# Patient Record
Sex: Female | Born: 1975 | Race: White | Hispanic: No | Marital: Married | Smoking: Former smoker
Health system: Southern US, Community
[De-identification: ages and names within clinical notes are randomized; demographics above are authoritative.]

## PROBLEM LIST (undated history)

## (undated) DIAGNOSIS — K589 Irritable bowel syndrome without diarrhea: Secondary | ICD-10-CM

## (undated) HISTORY — DX: Irritable bowel syndrome, unspecified: K58.9

---

## 2003-06-27 ENCOUNTER — Encounter: Admission: RE | Admit: 2003-06-27 | Discharge: 2003-06-27 | Payer: Self-pay | Admitting: Obstetrics and Gynecology

## 2003-06-27 ENCOUNTER — Other Ambulatory Visit: Admission: RE | Admit: 2003-06-27 | Discharge: 2003-06-27 | Payer: Self-pay | Admitting: Family Medicine

## 2003-07-11 ENCOUNTER — Encounter: Admission: RE | Admit: 2003-07-11 | Discharge: 2003-07-11 | Payer: Self-pay | Admitting: Obstetrics and Gynecology

## 2003-09-12 ENCOUNTER — Encounter: Admission: RE | Admit: 2003-09-12 | Discharge: 2003-09-12 | Payer: Self-pay | Admitting: Obstetrics and Gynecology

## 2003-09-26 ENCOUNTER — Encounter: Admission: RE | Admit: 2003-09-26 | Discharge: 2003-09-26 | Payer: Self-pay | Admitting: Obstetrics and Gynecology

## 2011-05-26 ENCOUNTER — Other Ambulatory Visit: Payer: Self-pay | Admitting: Dermatology

## 2012-06-22 ENCOUNTER — Encounter: Payer: Self-pay | Admitting: Cardiovascular Disease

## 2012-06-22 ENCOUNTER — Ambulatory Visit (INDEPENDENT_AMBULATORY_CARE_PROVIDER_SITE_OTHER): Payer: Commercial Managed Care - PPO | Admitting: Cardiovascular Disease

## 2012-06-22 VITALS — BP 122/70 | HR 82 | Ht 66.0 in | Wt 162.0 lb

## 2012-06-22 DIAGNOSIS — R0789 Other chest pain: Secondary | ICD-10-CM

## 2012-06-22 DIAGNOSIS — R011 Cardiac murmur, unspecified: Secondary | ICD-10-CM

## 2012-06-22 DIAGNOSIS — Z83438 Family history of other disorder of lipoprotein metabolism and other lipidemia: Secondary | ICD-10-CM

## 2012-06-22 DIAGNOSIS — Z8349 Family history of other endocrine, nutritional and metabolic diseases: Secondary | ICD-10-CM

## 2012-06-22 DIAGNOSIS — I4510 Unspecified right bundle-branch block: Secondary | ICD-10-CM

## 2012-06-22 DIAGNOSIS — R5381 Other malaise: Secondary | ICD-10-CM

## 2012-06-22 NOTE — Patient Instructions (Addendum)
Your physician has requested that you have an echocardiogram. Echocardiography is a painless test that uses sound waves to create images of your heart. It provides your doctor with information about the size and shape of your heart and how well your heart's chambers and valves are working. This procedure takes approximately one hour. There are no restrictions for this procedure.  Your physician has requested that you have an exercise myoview. For further information please visit [email or web address] Please follow instruction sheet, as given.   Your physician recommends that you return for lab work .  Your physician recommends that you schedule a follow-up appointment in:  4 to 6 weeks.

## 2012-06-28 ENCOUNTER — Encounter: Payer: Self-pay | Admitting: Cardiovascular Disease

## 2012-06-28 DIAGNOSIS — I4510 Unspecified right bundle-branch block: Secondary | ICD-10-CM | POA: Insufficient documentation

## 2012-06-28 DIAGNOSIS — R011 Cardiac murmur, unspecified: Secondary | ICD-10-CM | POA: Insufficient documentation

## 2012-06-28 DIAGNOSIS — R0789 Other chest pain: Secondary | ICD-10-CM | POA: Insufficient documentation

## 2012-06-28 DIAGNOSIS — Z83438 Family history of other disorder of lipoprotein metabolism and other lipidemia: Secondary | ICD-10-CM | POA: Insufficient documentation

## 2012-06-28 NOTE — Progress Notes (Signed)
Patient ID: Terri Wilkinson, female   DOB: 1975-12-27, 37 y.o.   MRN: [id number] PATIENT PROFILE: Terri Wilkinson is 37 year old female who recently experienced an episode of chest heaviness and was noted to have possible right bundle branch block. She now presents for cardiology evaluation.   HPI: Terri Wilkinson denies any known cardiac history. She was recently evaluated at [facility] by Dr. Rolan Lipa after experiencing an episode of chest heaviness and pressure which lasted most of the morning. Apparently she then underwent a CT scan and I do not have the results of this but apparently this was interpreted as normal. He was concerned that she may have right bundle branch block. She presents now for cardiology to evaluation. She denies any significant exertional precipitation to her chest discomfort. She does have a family history for heart disease.  No past medical history on file.  No past surgical history on file.  No Known Allergies  Current Outpatient Prescriptions  Medication Sig Dispense Refill  . Prenatal Vit-Fe Fum-FA-Omega (ONE-A-DAY WOMENS PRENATAL PO) Take by mouth.       No current facility-administered medications for this visit.    Socially, she is married for 8 years. She works as a Transport planner for [employer]. She achieved a bachelor degree from [employer]. She does housecleaning yard work he remains active. She does drink occasional wine. There is a remote tobacco history but she quit in 2010. He does walk and does Pilates several days per week.  No family history on file.  ROS is negative for fever chills night sweats. She denies rash. She denies palpitations. She denies presyncope or syncope. She denies wheezing. She is unaware of any known heart murmur. He does have some difficulty with her stomach at times. She does have a sister who has also colitis. She states her stomach problems have improved with probiotic treatment. He is concerned possibly that she may have some lactose  insufficiency or interval bowel syndrome but has never been fully diagnosed. She denies bleeding. She denies claudication. She denies neurologic symptoms. Other system review is negative.  PE BP 122/70  Pulse 82  Ht 5\' 6"  (1.676 m)  Wt 162 lb (73.483 kg)  BMI 26.16 kg/m2 General: Alert, oriented, no distress.  HEENT: Normocephalic, atraumatic. Pupils round and reactive; sclera anicteric; Fundi normal Nose without nasal septal hypertrophy Mouth/Parynx benign; Mallinpatti scale 2 Neck: No JVD, no carotid briuts Lungs: clear to ausculatation and percussion; no wheezing or rales Heart: RRR, s1 s2 normal  1-2 SEM, no s3. Abdomen: soft, nontender; no hepatosplenomehaly, BS+; abdominal aorta nontender and not dilated by palpation. Pulses 2+ Extremities: no clubbinbg cyanosis or edema, Homan's sign negative  Neurologic: grossly nonfocal    ECG: I did review the EKG which was done at [facility] on 05/10/2012 per this revealed sinus rhythm at 78 beats per minute with RV conduction delay/incomplete right bundle branch block. QTc interval 417 ms.  EKG today in our office reveals normal sinus rhythm at 82 beats per minute. There is a mild right ventricular conduction delay. She does not meet criteria for right bundle branch block.  ASSESSMENT AND PLAN: From a cardiac standpoint, Terri Wilkinson seems to be doing fairly well. She apparently had a CT scan of her chest and I will try to obtain the results. This apparently was done in [facility] and by the patient's report  this is negative. 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 She does have a cardiac murmur presently.  I'm checking a complete set of laboratory consisting of a CBC, comprehensive metabolic panel, lipid panel, TSH, as well as vitamin D level. I'm scheduling her for a 2-D echo Doppler study to further to further evaluate systolic and diastolic function as well as  her cardiac murmur. In light of her episode of chest heaviness which he described as a pressure sensation I am scheduling her for an exercise Myoview scan. I will see her back in the office in 4-6 weeks and followup of the above studies.    Lennette Bihari, MD, [facility] 06/28/2012 1:44 PM

## 2012-07-03 ENCOUNTER — Encounter: Payer: Self-pay | Admitting: Cardiovascular Disease

## 2012-07-18 ENCOUNTER — Ambulatory Visit (HOSPITAL_COMMUNITY)
Admission: RE | Admit: 2012-07-18 | Discharge: 2012-07-18 | Disposition: A | Discharge status: Home / Self Care | Payer: Commercial Managed Care - PPO | Source: Ambulatory Visit | Attending: Cardiovascular Disease | Admitting: Cardiovascular Disease

## 2012-07-18 DIAGNOSIS — R0789 Other chest pain: Secondary | ICD-10-CM

## 2012-07-18 DIAGNOSIS — R079 Chest pain, unspecified: Secondary | ICD-10-CM | POA: Insufficient documentation

## 2012-07-18 DIAGNOSIS — R9431 Abnormal electrocardiogram [ECG] [EKG]: Secondary | ICD-10-CM | POA: Insufficient documentation

## 2012-07-18 DIAGNOSIS — R011 Cardiac murmur, unspecified: Secondary | ICD-10-CM | POA: Insufficient documentation

## 2012-07-18 MED ORDER — TECHNETIUM TC 99M SESTAMIBI GENERIC - CARDIOLITE
10.6000 | Freq: Once | INTRAVENOUS | Status: AC | PRN
  Administered 2012-07-18: 11 via INTRAVENOUS

## 2012-07-18 MED ORDER — TECHNETIUM TC 99M SESTAMIBI GENERIC - CARDIOLITE
29.7000 | Freq: Once | INTRAVENOUS | Status: AC | PRN
  Administered 2012-07-18: 30 via INTRAVENOUS

## 2012-07-18 NOTE — Progress Notes (Signed)
[facility]   2D echo completed 07/18/2012.   Cindy Tahara Ruffini, RDCS  

## 2012-07-18 NOTE — Procedures (Addendum)
 [place] [facility] [address] [place] [address] [phone]  Cardiology Nuclear Med Study  Terri Wilkinson is a 37 y.o. female     MRN : [id number]     DOB: 1975/03/18  Procedure Date: 07/18/2012  Nuclear Med Background Indication for Stress Test:  Evaluation for Ischemia and Abnormal EKG History:  MURMUR Cardiac Risk Factors: Family History - CAD and History of Smoking  Symptoms:  Chest Pain   Nuclear Pre-Procedure Caffeine/Decaff Intake:  10:00pm NPO After: 8:00am   IV Site: R Antecubital  IV 0.9% NS with Angio Cath:  22g  Chest Size (in):  N/A IV Started by: Terri Pomfret, RN  Height: 5\' 6"  (1.676 m)  Cup Size: D  BMI:  Body mass index is 24.87 kg/(m^2). Weight:  154 lb (69.854 kg)   Tech Comments:  N/A    Nuclear Med Study 1 or 2 day study: 1 day  Stress Test Type:  Stress  Order Authorizing Provider:  Nicki Guadalajara, MD   Resting Radionuclide: Technetium 62m Sestamibi  Resting Radionuclide Dose: 10.6 mCi   Stress Radionuclide:  Technetium 47m Sestamibi  Stress Radionuclide Dose: 29.7 mCi           Stress Protocol Rest HR: 82 Stress HR: 164  Rest BP: 132/82 Stress BP: 151/111  Exercise Time (min): 9 METS: 10.1   Predicted Max HR: 182 bpm % Max HR: 90.11 bpm Rate Pressure Product: 21308  Dose of Adenosine (mg):  n/a Dose of Lexiscan: n/a mg  Dose of Atropine (mg): n/a Dose of Dobutamine: n/a mcg/kg/min (at max HR)  Stress Test Technologist: Terri Wilkinson, CCT Nuclear Technologist: Terri Wilkinson, CNMT   Rest Procedure:  Myocardial perfusion imaging was performed at rest 45 minutes following the intravenous administration of Technetium 59m Sestamibi. Stress Procedure:  The patient performed treadmill exercise using a Bruce  Protocol for 9 minutes. The patient stopped due to mild SOB and attaining target heart rate and denied any chest pain.  There were no significant ST-T wave changes.  Technetium 74m Sestamibi  was injected at peak exercise and myocardial perfusion imaging was performed after a brief delay.  Transient Ischemic Dilatation (Normal <1.22):  0.88 Lung/Heart Ratio (Normal <0.45):  0.28 QGS EDV:  81 ml QGS ESV:  27 ml LV Ejection Fraction: 67%  Rest ECG: NSR - Normal EKG  Stress ECG: Insignificant upsloping ST segment depression.  QPS Raw Data Images:  Normal; no motion artifact; normal heart/lung ratio. Stress Images:  Normal homogeneous uptake in all areas of the myocardium. Rest Images:  Normal homogeneous uptake in all areas of the myocardium. Subtraction (SDS):  No evidence of ischemia.  Impression Exercise Capacity:  Good exercise capacity. BP Response:  Normal blood pressure response. Clinical Symptoms:  No significant symptoms noted. ECG Impression:  Insignificant upsloping ST segment depression. Comparison with Prior Nuclear Study: No previous nuclear study performed  Overall Impression:  Normal stress nuclear study.  Good exercise effort. No ischemic EKG changes.  LV Wall Motion:  NL LV Function; NL Wall Motion; EF 67%.  Terri Nose, MD, [facility] Board Certified in Nuclear Cardiology Attending Cardiologist [facility]  Terri Nose, MD  07/18/2012 6:13 PM

## 2012-07-20 LAB — CBC
Platelets: 183 10*3/uL (ref 150–400)
RBC: 4.43 MIL/uL (ref 3.87–5.11)
RDW: 14.1 % (ref 11.5–15.5)
WBC: 3.9 10*3/uL — ABNORMAL LOW (ref 4.0–10.5)

## 2012-07-20 LAB — TSH: TSH: 1.93 u[IU]/mL (ref 0.350–4.500)

## 2012-07-20 LAB — COMPREHENSIVE METABOLIC PANEL
ALT: 11 U/L (ref 0–35)
AST: 11 U/L (ref 0–37)
Albumin: 4.1 g/dL (ref 3.5–5.2)
CO2: 24 mEq/L (ref 19–32)
Calcium: 9.1 mg/dL (ref 8.4–10.5)
Chloride: 105 mEq/L (ref 96–112)
Potassium: 4.2 mEq/L (ref 3.5–5.3)
Total Protein: 6.5 g/dL (ref 6.0–8.3)

## 2012-07-20 LAB — LIPID PANEL: Cholesterol: 168 mg/dL (ref 0–200)

## 2012-07-24 LAB — VITAMIN D 1,25 DIHYDROXY
Vitamin D 1, 25 (OH)2 Total: 86 pg/mL — ABNORMAL HIGH (ref 18–72)
Vitamin D2 1, 25 (OH)2: <8 pg/mL
Vitamin D3 1, 25 (OH)2: 86 pg/mL

## 2012-07-25 ENCOUNTER — Ambulatory Visit (INDEPENDENT_AMBULATORY_CARE_PROVIDER_SITE_OTHER): Payer: Commercial Managed Care - PPO | Admitting: Cardiovascular Disease

## 2012-07-25 ENCOUNTER — Encounter: Payer: Self-pay | Admitting: Cardiovascular Disease

## 2012-07-25 VITALS — BP 120/70 | HR 74 | Ht 66.0 in | Wt 159.9 lb

## 2012-07-25 DIAGNOSIS — R011 Cardiac murmur, unspecified: Secondary | ICD-10-CM

## 2012-07-25 DIAGNOSIS — Z83438 Family history of other disorder of lipoprotein metabolism and other lipidemia: Secondary | ICD-10-CM

## 2012-07-25 DIAGNOSIS — R0789 Other chest pain: Secondary | ICD-10-CM

## 2012-07-25 DIAGNOSIS — Z8349 Family history of other endocrine, nutritional and metabolic diseases: Secondary | ICD-10-CM

## 2012-07-25 NOTE — Progress Notes (Signed)
Patient ID: Terri Wilkinson, female   DOB: 1975/11/28, 37 y.o.   MRN: [id number]  HPI: Terri Wilkinson presents to the office today for followup evaluation after experiencing an episode of chest heaviness and was noted to have possible right bundle branch block. I had seen Terri Wilkinson for initial cardiology consultation on 06/28/2012.   Terri Wilkinson was recently evaluated at [facility] by Dr. Rolan Lipa after experiencing an episode of chest heaviness and pressure which lasted most of the morning. Apparently Terri Wilkinson then underwent a CT scan  but apparently this was interpreted as normal. He was concerned that Terri Wilkinson may have right bundle branch block. Terri Wilkinson presented  for cardiology to evaluation. Terri Wilkinson denies any significant exertional precipitation to Terri Wilkinson chest discomfort. Terri Wilkinson does have a family history for heart disease.  This durum underwent a nuclear perfusion study on 07/18/2012 which revealed entirely normal perfusion and normal function. Ejection fraction post-stress was 67%. An echo Doppler study showed normal systolic and diastolic function with an ejection fraction of 55-60%. K. trileaflet aortic valve. Terri Wilkinson was not found to have significant valvular abnormalities.  Laboratory done at [facility] revealed a normal CBC as well as comprehensive metabolic panel. Terri Wilkinson total cholesterol was 168 triglycerides 168 HDL 54 LDL cholesterol 80. Vitamin D level 89,TSH 1.9 thyroid function normal. Terri Wilkinson presents for evaluation  No past medical history on file.   No Known Allergies  Current Outpatient Prescriptions  Medication Sig Dispense Refill  . Prenatal Vit-Fe Fum-FA-Omega (ONE-A-DAY WOMENS PRENATAL PO) Take by mouth.      . Probiotic Product (PROBIOTIC DAILY PO) Take by mouth daily.       No current facility-administered medications for this visit.    Socially, Terri Wilkinson is married for 8 years. 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 Terri Wilkinson achieved a bachelor degree from [employer]. Terri Wilkinson does housecleaning yard work he remains active.  Terri Wilkinson does drink occasional wine. There is a remote tobacco history but Terri Wilkinson quit in 2010. He does walk and does Pilates several days per week.   ROS is negative for fever chills night sweats. Terri Wilkinson denies rash. Terri Wilkinson denies palpitations. Terri Wilkinson denies presyncope or syncope. Terri Wilkinson denies wheezing. Terri Wilkinson is unaware of any known heart murmur. He does have some difficulty with Terri Wilkinson stomach at times. Terri Wilkinson does have a sister who has also colitis. Terri Wilkinson states Terri Wilkinson stomach problems have improved with probiotic treatment. He is concerned possibly that Terri Wilkinson may have some lactose insufficiency or interval bowel syndrome but has never been fully diagnosed. Terri Wilkinson denies bleeding. Terri Wilkinson denies claudication. Terri Wilkinson denies neurologic symptoms. Other system review is negative.  PE BP 120/70  Pulse 74  Ht 5\' 6"  (1.676 m)  Wt 159 lb 14.4 oz (72.53 kg)  BMI 25.82 kg/m2 General: Alert, oriented, no distress.  HEENT: Normocephalic, atraumatic. Pupils round and reactive; sclera anicteric;  Nose without nasal septal hypertrophy Mouth/Parynx benign; Mallinpatti scale 2 Neck: No JVD, no carotid briuts Lungs: clear to ausculatation and percussion; no wheezing or rales Heart: RRR, s1 s2 normal  1 SEM, no s3. Abdomen: soft, nontender; no hepatosplenomehaly, BS+; abdominal aorta nontender and not dilated by palpation. Pulses 2+ Extremities: no clubbinbg cyanosis or edema, Homan's sign negative  Neurologic: grossly nonfocal   ECG: I did review the EKG which was done at [facility] on 05/10/2012 per this revealed sinus rhythm at 78 beats per minute with RV conduction delay/incomplete right bundle branch block. QTc interval 417 ms.  EKG on 06/27/12 revealed normal sinus rhythm at  82 beats per minute. There is a mild right ventricular conduction delay. Terri Wilkinson does not meet criteria for right bundle branch block.  ASSESSMENT AND PLAN: From a cardiac standpoint, Terri Wilkinson seems to be doing fairly well. Terri Wilkinson recently had experience a vague chest  heaviness during some periods of increased stress. A recent nuclear perfusion study shows entirely normal myocardial perfusion without scar or ischemia and normal global LV contractility. Terri Wilkinson echo Doppler study is essentially normal in artery is again significant valvular pathology. I did review Terri Wilkinson laboratory with Terri Wilkinson in detail. We did discuss slight reduction in carbohydrates and sugars in light of Terri Wilkinson elevated triglycerides. The cardiac standpoint, I feel Terri Wilkinson is stable and no further workup is necessary at this point. I'll see Terri Wilkinson on a PRN basis if problems arise.   Lennette Bihari, MD, [facility] 07/25/2012 9:03 PM

## 2012-07-25 NOTE — Patient Instructions (Signed)
Your physician recommends that you schedule a follow-up appointment in: AS NEEDED  

## 2012-07-26 ENCOUNTER — Encounter: Payer: Self-pay | Admitting: Cardiovascular Disease

## 2012-07-26 NOTE — Progress Notes (Signed)
Quick Note:  Results discussed at 07/25/12/appointment. ______

## 2015-02-24 ENCOUNTER — Other Ambulatory Visit: Payer: Self-pay | Admitting: Obstetrics & Gynecology

## 2015-02-24 DIAGNOSIS — R928 Other abnormal and inconclusive findings on diagnostic imaging of breast: Secondary | ICD-10-CM

## 2015-02-26 ENCOUNTER — Ambulatory Visit
Admission: RE | Admit: 2015-02-26 | Discharge: 2015-02-26 | Disposition: A | Discharge status: Home / Self Care | Payer: BLUE CROSS/BLUE SHIELD | Source: Ambulatory Visit | Attending: Obstetrics & Gynecology | Admitting: Obstetrics & Gynecology

## 2015-02-26 DIAGNOSIS — R928 Other abnormal and inconclusive findings on diagnostic imaging of breast: Secondary | ICD-10-CM

## 2015-08-11 ENCOUNTER — Other Ambulatory Visit: Payer: Self-pay | Admitting: Obstetrics & Gynecology

## 2015-08-11 DIAGNOSIS — N6310 Unspecified lump in the right breast, unspecified quadrant: Secondary | ICD-10-CM

## 2015-08-27 ENCOUNTER — Other Ambulatory Visit: Payer: BLUE CROSS/BLUE SHIELD

## 2015-09-02 ENCOUNTER — Ambulatory Visit
Admission: RE | Admit: 2015-09-02 | Discharge: 2015-09-02 | Disposition: A | Discharge status: Home / Self Care | Payer: BLUE CROSS/BLUE SHIELD | Source: Ambulatory Visit | Attending: Obstetrics & Gynecology | Admitting: Obstetrics & Gynecology

## 2015-09-02 DIAGNOSIS — N63 Unspecified lump in breast: Secondary | ICD-10-CM | POA: Diagnosis not present

## 2015-09-02 DIAGNOSIS — N6310 Unspecified lump in the right breast, unspecified quadrant: Secondary | ICD-10-CM

## 2016-02-16 DIAGNOSIS — H6121 Impacted cerumen, right ear: Secondary | ICD-10-CM | POA: Diagnosis not present

## 2016-02-16 DIAGNOSIS — Z23 Encounter for immunization: Secondary | ICD-10-CM | POA: Diagnosis not present

## 2016-03-24 DIAGNOSIS — Z01419 Encounter for gynecological examination (general) (routine) without abnormal findings: Secondary | ICD-10-CM | POA: Diagnosis not present

## 2016-03-24 DIAGNOSIS — Z6824 Body mass index (BMI) 24.0-24.9, adult: Secondary | ICD-10-CM | POA: Diagnosis not present

## 2016-03-24 DIAGNOSIS — Z1151 Encounter for screening for human papillomavirus (HPV): Secondary | ICD-10-CM | POA: Diagnosis not present

## 2016-08-05 ENCOUNTER — Other Ambulatory Visit: Payer: Self-pay

## 2016-08-05 ENCOUNTER — Other Ambulatory Visit: Payer: Self-pay | Admitting: Obstetrics & Gynecology

## 2016-08-05 DIAGNOSIS — N63 Unspecified lump in unspecified breast: Secondary | ICD-10-CM

## 2016-08-10 ENCOUNTER — Ambulatory Visit: Admission: RE | Admit: 2016-08-10 | Payer: BLUE CROSS/BLUE SHIELD | Source: Ambulatory Visit

## 2016-08-10 ENCOUNTER — Ambulatory Visit
Admission: RE | Admit: 2016-08-10 | Discharge: 2016-08-10 | Disposition: A | Discharge status: Home / Self Care | Payer: BLUE CROSS/BLUE SHIELD | Source: Ambulatory Visit | Attending: Obstetrics & Gynecology | Admitting: Obstetrics & Gynecology

## 2016-08-10 DIAGNOSIS — N63 Unspecified lump in unspecified breast: Secondary | ICD-10-CM

## 2016-08-10 DIAGNOSIS — R928 Other abnormal and inconclusive findings on diagnostic imaging of breast: Secondary | ICD-10-CM | POA: Diagnosis not present

## 2016-10-25 DIAGNOSIS — R197 Diarrhea, unspecified: Secondary | ICD-10-CM | POA: Diagnosis not present

## 2016-12-07 DIAGNOSIS — R197 Diarrhea, unspecified: Secondary | ICD-10-CM | POA: Diagnosis not present

## 2016-12-07 DIAGNOSIS — K640 First degree hemorrhoids: Secondary | ICD-10-CM | POA: Diagnosis not present

## 2016-12-08 DIAGNOSIS — G43009 Migraine without aura, not intractable, without status migrainosus: Secondary | ICD-10-CM | POA: Diagnosis not present

## 2016-12-09 DIAGNOSIS — R197 Diarrhea, unspecified: Secondary | ICD-10-CM | POA: Diagnosis not present

## 2017-03-31 DIAGNOSIS — S139XXA Sprain of joints and ligaments of unspecified parts of neck, initial encounter: Secondary | ICD-10-CM | POA: Diagnosis not present

## 2017-04-25 ENCOUNTER — Other Ambulatory Visit: Payer: Self-pay

## 2017-04-27 ENCOUNTER — Other Ambulatory Visit: Payer: Self-pay | Admitting: *Deleted

## 2017-04-27 MED ORDER — NORETHINDRONE ACET-ETHINYL EST 1-20 MG-MCG PO TABS: 1.0000 | ORAL_TABLET | Freq: Every day | ORAL | 0 refills | Status: DC

## 2017-04-27 NOTE — Telephone Encounter (Signed)
Pt has annual scheduled on 06/30/17, needs refill on birth control pills, Rx sent.

## 2017-06-15 ENCOUNTER — Ambulatory Visit: Payer: BLUE CROSS/BLUE SHIELD | Admitting: Psychology

## 2017-06-15 DIAGNOSIS — F411 Generalized anxiety disorder: Secondary | ICD-10-CM | POA: Diagnosis not present

## 2017-06-21 DIAGNOSIS — M7051 Other bursitis of knee, right knee: Secondary | ICD-10-CM | POA: Diagnosis not present

## 2017-06-22 ENCOUNTER — Ambulatory Visit: Payer: BLUE CROSS/BLUE SHIELD | Admitting: Psychology

## 2017-06-22 DIAGNOSIS — F411 Generalized anxiety disorder: Secondary | ICD-10-CM | POA: Diagnosis not present

## 2017-06-30 ENCOUNTER — Encounter: Payer: Self-pay | Admitting: Obstetrics & Gynecology

## 2017-06-30 ENCOUNTER — Ambulatory Visit (INDEPENDENT_AMBULATORY_CARE_PROVIDER_SITE_OTHER): Payer: BLUE CROSS/BLUE SHIELD | Admitting: Obstetrics & Gynecology

## 2017-06-30 ENCOUNTER — Ambulatory Visit: Payer: BLUE CROSS/BLUE SHIELD | Admitting: Psychology

## 2017-06-30 VITALS — BP 134/82 | Ht 66.0 in | Wt 149.0 lb

## 2017-06-30 DIAGNOSIS — Z01419 Encounter for gynecological examination (general) (routine) without abnormal findings: Secondary | ICD-10-CM | POA: Diagnosis not present

## 2017-06-30 DIAGNOSIS — Z3041 Encounter for surveillance of contraceptive pills: Secondary | ICD-10-CM

## 2017-06-30 MED ORDER — NORETHINDRONE ACET-ETHINYL EST 1-20 MG-MCG PO TABS: 1.0000 | ORAL_TABLET | Freq: Every day | ORAL | 5 refills | Status: DC

## 2017-06-30 NOTE — Progress Notes (Signed)
    [facility] 05/26/1975 [id number]   History:    42 y.o. G0 Married  RP:  Established patient presenting for annual gyn exam   HPI: Well on continuous Junel 1/20.  No BTB.  LEEP in 2004.  Paps normal since then.  Breasts wnl.  BMI 24.05.  In good fitness.  Fasting Health Labs normal 2019 through husband's insurance.  Past medical history,surgical history, family history and social history were all reviewed and documented in the EPIC chart.  Gynecologic History No LMP recorded. (Menstrual status: Oral contraceptives). Contraception: OCP (estrogen/progesterone) Last Pap: 03/2016. Results were: Negative, HPV HR neg Last mammogram: 08/2016. Results were: Benign Bone Density: Never Colonoscopy: Never  Obstetric History OB History  Gravida Para Term Preterm AB Living  0 0 0 0 0 0  SAB TAB Ectopic Multiple Live Births  0 0 0 0 0     ROS: A ROS was performed and pertinent positives and negatives are included in the history.  GENERAL: No fevers or chills. HEENT: No change in vision, no earache, sore throat or sinus congestion. NECK: No pain or stiffness. CARDIOVASCULAR: No chest pain or pressure. No palpitations. PULMONARY: No shortness of breath, cough or wheeze. GASTROINTESTINAL: No abdominal pain, nausea, vomiting or diarrhea, melena or bright red blood per rectum. GENITOURINARY: No urinary frequency, urgency, hesitancy or dysuria. MUSCULOSKELETAL: No joint or muscle pain, no back pain, no recent trauma. DERMATOLOGIC: No rash, no itching, no lesions. ENDOCRINE: No polyuria, polydipsia, no heat or cold intolerance. No recent change in weight. HEMATOLOGICAL: No anemia or easy bruising or bleeding. NEUROLOGIC: No headache, seizures, numbness, tingling or weakness. PSYCHIATRIC: No depression, no loss of interest in normal activity or change in sleep pattern.     Exam:   BP (!) 144/90   Ht  (1.676 m)   Wt 149 lb (67.6 kg)   BMI 24.05 kg/m   Body mass index is 24.05  kg/m.  General appearance : Well developed well nourished female. No acute distress HEENT: Eyes: no retinal hemorrhage or exudates,  Neck supple, trachea midline, no carotid bruits, no thyroidmegaly Lungs: Clear to auscultation, no rhonchi or wheezes, or rib retractions  Heart: Regular rate and rhythm, no murmurs or gallops Breast:Examined in sitting and supine position were symmetrical in appearance, no palpable masses or tenderness,  no skin retraction, no nipple inversion, no nipple discharge, no skin discoloration, no axillary or supraclavicular lymphadenopathy Abdomen: no palpable masses or tenderness, no rebound or guarding Extremities: no edema or skin discoloration or tenderness  Pelvic: Vulva: Normal             Vagina: No gross lesions or discharge  Cervix: No gross lesions or discharge  Uterus  AV, normal size, shape and consistency, non-tender and mobile  Adnexa  Without masses or tenderness  Anus: Normal   Assessment/Plan:  42 y.o. female for annual exam   1. Well female exam with routine gynecological exam Normal gynecologic exam.  Pap negative with negative high risk HPV in February 2018.  Will repeat Pap test next year.  Breast exam normal.  Repeat screening mammogram in July 2019.  Health labs with her husband's office insurance.  2. Encounter for surveillance of contraceptive pills Well on Junel 1/20.  No contraindication.  Birth control pill prescription sent to pharmacy.  Other orders - norethindrone-ethinyl estradiol (MICROGESTIN,JUNEL,LOESTRIN) 1-20 MG-MCG tablet; Take 1 tablet by mouth daily. Continuous use  Genia Del MD, 8:22 AM 06/30/2017

## 2017-06-30 NOTE — Patient Instructions (Signed)
1. Well female exam with routine gynecological exam Normal gynecologic exam.  Pap negative with negative high risk HPV in February 2018.  Will repeat Pap test next year.  Breast exam normal.  Repeat screening mammogram in July 2019.  Health labs with her husband's office insurance.  2. Encounter for surveillance of contraceptive pills Well on Junel 1/20.  No contraindication.  Birth control pill prescription sent to pharmacy.  Other orders - norethindrone-ethinyl estradiol (MICROGESTIN,JUNEL,LOESTRIN) 1-20 MG-MCG tablet; Take 1 tablet by mouth daily. Continuous use  Marianela, it was a pleasure seeing you today!

## 2017-07-07 ENCOUNTER — Ambulatory Visit: Payer: BLUE CROSS/BLUE SHIELD | Admitting: Psychology

## 2017-07-07 DIAGNOSIS — F411 Generalized anxiety disorder: Secondary | ICD-10-CM

## 2017-07-20 DIAGNOSIS — M7051 Other bursitis of knee, right knee: Secondary | ICD-10-CM | POA: Diagnosis not present

## 2017-07-20 DIAGNOSIS — M25561 Pain in right knee: Secondary | ICD-10-CM | POA: Diagnosis not present

## 2017-07-26 ENCOUNTER — Other Ambulatory Visit: Payer: Self-pay | Admitting: Obstetrics & Gynecology

## 2017-07-26 DIAGNOSIS — Z1231 Encounter for screening mammogram for malignant neoplasm of breast: Secondary | ICD-10-CM

## 2017-08-12 ENCOUNTER — Ambulatory Visit: Payer: BLUE CROSS/BLUE SHIELD | Admitting: Psychology

## 2017-08-24 ENCOUNTER — Ambulatory Visit
Admission: RE | Admit: 2017-08-24 | Discharge: 2017-08-24 | Disposition: A | Discharge status: Home / Self Care | Payer: BLUE CROSS/BLUE SHIELD | Source: Ambulatory Visit | Attending: Obstetrics & Gynecology | Admitting: Obstetrics & Gynecology

## 2017-08-24 ENCOUNTER — Ambulatory Visit: Payer: BLUE CROSS/BLUE SHIELD | Admitting: Psychology

## 2017-08-24 ENCOUNTER — Other Ambulatory Visit: Payer: Self-pay | Admitting: Obstetrics & Gynecology

## 2017-08-24 DIAGNOSIS — Z1231 Encounter for screening mammogram for malignant neoplasm of breast: Secondary | ICD-10-CM

## 2017-08-24 DIAGNOSIS — N6310 Unspecified lump in the right breast, unspecified quadrant: Secondary | ICD-10-CM

## 2017-08-24 DIAGNOSIS — R928 Other abnormal and inconclusive findings on diagnostic imaging of breast: Secondary | ICD-10-CM | POA: Diagnosis not present

## 2017-11-01 DIAGNOSIS — M79641 Pain in right hand: Secondary | ICD-10-CM | POA: Diagnosis not present

## 2017-11-01 DIAGNOSIS — Z23 Encounter for immunization: Secondary | ICD-10-CM | POA: Diagnosis not present

## 2017-11-29 DIAGNOSIS — M150 Primary generalized (osteo)arthritis: Secondary | ICD-10-CM | POA: Diagnosis not present

## 2017-11-29 DIAGNOSIS — K580 Irritable bowel syndrome with diarrhea: Secondary | ICD-10-CM | POA: Diagnosis not present

## 2017-11-29 DIAGNOSIS — M2550 Pain in unspecified joint: Secondary | ICD-10-CM | POA: Diagnosis not present

## 2018-01-02 DIAGNOSIS — M2550 Pain in unspecified joint: Secondary | ICD-10-CM | POA: Diagnosis not present

## 2018-01-02 DIAGNOSIS — M150 Primary generalized (osteo)arthritis: Secondary | ICD-10-CM | POA: Diagnosis not present

## 2018-01-02 DIAGNOSIS — K580 Irritable bowel syndrome with diarrhea: Secondary | ICD-10-CM | POA: Diagnosis not present

## 2018-02-06 DIAGNOSIS — H53143 Visual discomfort, bilateral: Secondary | ICD-10-CM | POA: Diagnosis not present

## 2018-02-06 DIAGNOSIS — R51 Headache: Secondary | ICD-10-CM | POA: Diagnosis not present

## 2018-03-29 DIAGNOSIS — R6889 Other general symptoms and signs: Secondary | ICD-10-CM | POA: Diagnosis not present

## 2018-03-29 DIAGNOSIS — J029 Acute pharyngitis, unspecified: Secondary | ICD-10-CM | POA: Diagnosis not present

## 2018-06-14 ENCOUNTER — Other Ambulatory Visit: Payer: Self-pay | Admitting: Obstetrics & Gynecology

## 2018-06-14 NOTE — Telephone Encounter (Signed)
Annual exam scheduled on 07/07/18

## 2018-07-06 ENCOUNTER — Other Ambulatory Visit: Payer: Self-pay

## 2018-07-07 ENCOUNTER — Ambulatory Visit (INDEPENDENT_AMBULATORY_CARE_PROVIDER_SITE_OTHER): Payer: BC Managed Care – PPO | Admitting: Obstetrics & Gynecology

## 2018-07-07 ENCOUNTER — Encounter: Payer: Self-pay | Admitting: Obstetrics & Gynecology

## 2018-07-07 VITALS — BP 120/76 | Ht 66.0 in | Wt 146.0 lb

## 2018-07-07 DIAGNOSIS — Z3041 Encounter for surveillance of contraceptive pills: Secondary | ICD-10-CM | POA: Diagnosis not present

## 2018-07-07 DIAGNOSIS — Z01419 Encounter for gynecological examination (general) (routine) without abnormal findings: Secondary | ICD-10-CM

## 2018-07-07 DIAGNOSIS — Z9889 Other specified postprocedural states: Secondary | ICD-10-CM

## 2018-07-07 MED ORDER — NORETHINDRONE ACET-ETHINYL EST 1-20 MG-MCG PO TABS: ORAL_TABLET | ORAL | 5 refills | Status: DC

## 2018-07-07 NOTE — Addendum Note (Signed)
Addended by: Berna Spare A on: 07/07/2018 02:24 PM   Modules accepted: Orders

## 2018-07-07 NOTE — Progress Notes (Signed)
Terri Wilkinson [birth year] [id number]   History:    43 y.o. G0 Married  RP:  Established patient presenting for annual gyn exam   HPI: Well on Janel 1/20 continuous use.  No breakthrough bleeding.  No pelvic pain.  No pain with intercourse.  Normal vaginal secretions.  Breasts normal.  Urine and bowel movements normal.  Body mass index good at 23.57.  Good fitness and healthy nutrition.  Health labs through husband's work.  Past medical history,surgical history, family history and social history were all reviewed and documented in the EPIC chart.  Gynecologic History No LMP recorded. (Menstrual status: Oral contraceptives). Contraception: OCP (estrogen/progesterone) Last Pap: 03/2016. Results were: Negative/HPV HR neg Last mammogram: 03/2017. Results were: Benign Bone Density: Never Colonoscopy: Never  Obstetric History OB History  Gravida Para Term Preterm AB Living  0 0 0 0 0 0  SAB TAB Ectopic Multiple Live Births  0 0 0 0 0     ROS: A ROS was performed and pertinent positives and negatives are included in the history.  GENERAL: No fevers or chills. HEENT: No change in vision, no earache, sore throat or sinus congestion. NECK: No pain or stiffness. CARDIOVASCULAR: No chest pain or pressure. No palpitations. PULMONARY: No shortness of breath, cough or wheeze. GASTROINTESTINAL: No abdominal pain, nausea, vomiting or diarrhea, melena or bright red blood per rectum. GENITOURINARY: No urinary frequency, urgency, hesitancy or dysuria. MUSCULOSKELETAL: No joint or muscle pain, no back pain, no recent trauma. DERMATOLOGIC: No rash, no itching, no lesions. ENDOCRINE: No polyuria, polydipsia, no heat or cold intolerance. No recent change in weight. HEMATOLOGICAL: No anemia or easy bruising or bleeding. NEUROLOGIC: No headache, seizures, numbness, tingling or weakness. PSYCHIATRIC: No depression, no loss of interest in normal activity or change in sleep pattern.     Exam:   BP 120/76   Ht  5\' 6"  (1.676 m)   Wt 146 lb (66.2 kg)   BMI 23.57 kg/m   Body mass index is 23.57 kg/m.  General appearance : Well developed well nourished female. No acute distress HEENT: Eyes: no retinal hemorrhage or exudates,  Neck supple, trachea midline, no carotid bruits, no thyroidmegaly Lungs: Clear to auscultation, no rhonchi or wheezes, or rib retractions  Heart: Regular rate and rhythm, no murmurs or gallops Breast:Examined in sitting and supine position were symmetrical in appearance, no palpable masses or tenderness,  no skin retraction, no nipple inversion, no nipple discharge, no skin discoloration, no axillary or supraclavicular lymphadenopathy Abdomen: no palpable masses or tenderness, no rebound or guarding Extremities: no edema or skin discoloration or tenderness  Pelvic: Vulva: Normal             Vagina: No gross lesions or discharge  Cervix: No gross lesions or discharge.  Pap reflex done.  Uterus  AV, normal size, shape and consistency, non-tender and mobile  Adnexa  Without masses or tenderness  Anus: Normal   Assessment/Plan:  42 y.o. female for annual exam   1. Encounter for routine gynecological examination with Papanicolaou smear of cervix Normal gynecologic exam.  Pap reflex done.  Breast exam normal.  Last screening mammogram July 2019 was benign.  Good body mass index at 23.57.  Continue with fitness and healthy nutrition.  Health labs through husband's work.  2. H/O LEEP Severe dysplasia, LEEP in 2004, Paps normal since then.  3. Encounter for surveillance of contraceptive pills Well on continuous Junel 1/20.  No contraindication to birth control pills.  Prescription sent to  pharmacy.  Other orders - Multiple Vitamin (MULTIVITAMIN) tablet; Take 1 tablet by mouth daily. - norethindrone-ethinyl estradiol (JUNEL 1/20) 1-20 MG-MCG tablet; TAKE 1 TABLET BY MOUTH DAILY FOR CONTINUOUS USE  Terri DelMarie-Lyne Ayda Tancredi MD, 8:39 AM 07/07/2018

## 2018-07-07 NOTE — Patient Instructions (Signed)
1. Encounter for routine gynecological examination with Papanicolaou smear of cervix Normal gynecologic exam.  Pap reflex done.  Breast exam normal.  Last screening mammogram July 2019 was benign.  Good body mass index at 23.57.  Continue with fitness and healthy nutrition.  Health labs through husband's work.  2. H/O LEEP Severe dysplasia, LEEP in 2004, Paps normal since then.  3. Encounter for surveillance of contraceptive pills Well on continuous Junel 1/20.  No contraindication to birth control pills.  Prescription sent to pharmacy.  Other orders - Multiple Vitamin (MULTIVITAMIN) tablet; Take 1 tablet by mouth daily. - norethindrone-ethinyl estradiol (JUNEL 1/20) 1-20 MG-MCG tablet; TAKE 1 TABLET BY MOUTH DAILY FOR CONTINUOUS USE  Anays, it was a pleasure seeing you today!  I will inform you of your results as soon as they are available.

## 2018-07-10 LAB — PAP IG W/ RFLX HPV ASCU

## 2018-09-13 DIAGNOSIS — L814 Other melanin hyperpigmentation: Secondary | ICD-10-CM | POA: Diagnosis not present

## 2018-09-13 DIAGNOSIS — L309 Dermatitis, unspecified: Secondary | ICD-10-CM | POA: Diagnosis not present

## 2018-11-03 ENCOUNTER — Other Ambulatory Visit: Payer: Self-pay | Admitting: Obstetrics & Gynecology

## 2018-11-03 DIAGNOSIS — Z1231 Encounter for screening mammogram for malignant neoplasm of breast: Secondary | ICD-10-CM

## 2018-11-21 DIAGNOSIS — Z23 Encounter for immunization: Secondary | ICD-10-CM | POA: Diagnosis not present

## 2018-11-29 ENCOUNTER — Other Ambulatory Visit: Payer: Self-pay

## 2018-11-29 ENCOUNTER — Ambulatory Visit
Admission: RE | Admit: 2018-11-29 | Discharge: 2018-11-29 | Disposition: A | Discharge status: Home / Self Care | Payer: BC Managed Care – PPO | Source: Ambulatory Visit | Attending: Obstetrics & Gynecology | Admitting: Obstetrics & Gynecology

## 2018-11-29 DIAGNOSIS — Z1231 Encounter for screening mammogram for malignant neoplasm of breast: Secondary | ICD-10-CM | POA: Diagnosis not present

## 2019-04-08 ENCOUNTER — Ambulatory Visit: Payer: Self-pay | Attending: Internal Medicine

## 2019-04-08 DIAGNOSIS — Z23 Encounter for immunization: Secondary | ICD-10-CM | POA: Insufficient documentation

## 2019-04-08 NOTE — Progress Notes (Signed)
   Covid-19 Vaccination Clinic  Name:  Terri Wilkinson    MRN: [id number] DOB: Jan 28, 1976  04/08/2019  [name] was observed post Covid-19 immunization for 15 minutes without incident. She was provided with Vaccine Information Sheet and instruction to access the V-Safe system.   [name] was instructed to call 911 with any severe reactions post vaccine: Marland Kitchen Difficulty breathing  . Swelling of face and throat  . A fast heartbeat  . A bad rash all over body  . Dizziness and weakness   Immunizations Administered    Name Date Dose VIS Date Route   Pfizer COVID-19 Vaccine 04/08/2019 11:54 AM 0.3 mL 01/12/2019 Intramuscular   Manufacturer: [employer], [employer]   Lot: [id number]   NDC: [address]-7

## 2019-05-05 ENCOUNTER — Ambulatory Visit: Payer: Self-pay | Attending: Internal Medicine

## 2019-05-05 DIAGNOSIS — Z23 Encounter for immunization: Secondary | ICD-10-CM

## 2019-05-05 NOTE — Progress Notes (Signed)
   Covid-19 Vaccination Clinic  Name:  Terri Wilkinson    MRN: [id number] DOB: Jul 30, 1975  05/05/2019  [name] was observed post Covid-19 immunization for 15 minutes without incident. She was provided with Vaccine Information Sheet and instruction to access the V-Safe system.   [name] was instructed to call 911 with any severe reactions post vaccine: Marland Kitchen Difficulty breathing  . Swelling of face and throat  . A fast heartbeat  . A bad rash all over body  . Dizziness and weakness   Immunizations Administered    Name Date Dose VIS Date Route   Pfizer COVID-19 Vaccine 05/05/2019 12:16 PM 0.3 mL 01/12/2019 Intramuscular   Manufacturer: [employer], [employer]   Lot: [id number]   NDC: [address]-6

## 2019-05-09 ENCOUNTER — Ambulatory Visit: Payer: Self-pay

## 2019-05-15 DIAGNOSIS — Z0189 Encounter for other specified special examinations: Secondary | ICD-10-CM | POA: Diagnosis not present

## 2019-07-09 ENCOUNTER — Other Ambulatory Visit: Payer: Self-pay

## 2019-07-10 ENCOUNTER — Encounter: Payer: Self-pay | Admitting: Obstetrics & Gynecology

## 2019-07-10 ENCOUNTER — Ambulatory Visit (INDEPENDENT_AMBULATORY_CARE_PROVIDER_SITE_OTHER): Payer: BC Managed Care – PPO | Admitting: Obstetrics & Gynecology

## 2019-07-10 VITALS — BP 136/88 | Ht 66.0 in | Wt 145.0 lb

## 2019-07-10 DIAGNOSIS — Z01419 Encounter for gynecological examination (general) (routine) without abnormal findings: Secondary | ICD-10-CM | POA: Diagnosis not present

## 2019-07-10 DIAGNOSIS — Z3041 Encounter for surveillance of contraceptive pills: Secondary | ICD-10-CM

## 2019-07-10 MED ORDER — NORETHINDRONE ACET-ETHINYL EST 1-20 MG-MCG PO TABS: ORAL_TABLET | ORAL | 5 refills | Status: DC

## 2019-07-10 NOTE — Patient Instructions (Signed)
1. Well female exam with routine gynecological exam Normal gynecologic exam.  Pap test June 2020 was negative with negative, no indication to repeat this year.  History of LEEP but more than 15 years ago and Pap tests negative since then.  Breast exam normal.  Screening mammogram October 2020 was negative.  Health labs with family physician.  Very good body mass index at 23.4.  Continue with fitness and healthy nutrition.  2. Encounter for surveillance of contraceptive pills Well on birth control pills with Junel 1/20 continuous use.  No breakthrough bleeding.  No contraindication to continue.  Prescription sent to pharmacy.  Other orders - norethindrone-ethinyl estradiol (JUNEL 1/20) 1-20 MG-MCG tablet; TAKE 1 TABLET BY MOUTH DAILY FOR CONTINUOUS USE  Dewey, it was a pleasure seeing you today!

## 2019-07-10 NOTE — Progress Notes (Signed)
[place] 1975/04/03 [id number]   History:    44 y.o. G0 Married  RP:  Established patient presenting for annual gyn exam   HPI: Well on Junel 1/20 continuous use.  No breakthrough bleeding.  No pelvic pain.  No pain with intercourse.  Normal vaginal secretions.  Breasts normal.  Urine and bowel movements normal.  Body mass index good at 23.4.  Good fitness and healthy nutrition.  Health labs through husband's work.   Past medical history,surgical history, family history and social history were all reviewed and documented in the EPIC chart.  Gynecologic History No LMP recorded. (Menstrual status: Oral contraceptives).  Obstetric History OB History  Gravida Para Term Preterm AB Living  0 0 0 0 0 0  SAB TAB Ectopic Multiple Live Births  0 0 0 0 0     ROS: A ROS was performed and pertinent positives and negatives are included in the history.  GENERAL: No fevers or chills. HEENT: No change in vision, no earache, sore throat or sinus congestion. NECK: No pain or stiffness. CARDIOVASCULAR: No chest pain or pressure. No palpitations. PULMONARY: No shortness of breath, cough or wheeze. GASTROINTESTINAL: No abdominal pain, nausea, vomiting or diarrhea, melena or bright red blood per rectum. GENITOURINARY: No urinary frequency, urgency, hesitancy or dysuria. MUSCULOSKELETAL: No joint or muscle pain, no back pain, no recent trauma. DERMATOLOGIC: No rash, no itching, no lesions. ENDOCRINE: No polyuria, polydipsia, no heat or cold intolerance. No recent change in weight. HEMATOLOGICAL: No anemia or easy bruising or bleeding. NEUROLOGIC: No headache, seizures, numbness, tingling or weakness. PSYCHIATRIC: No depression, no loss of interest in normal activity or change in sleep pattern.     Exam:   BP 136/88   Ht 5\' 6"  (1.676 m)   Wt 145 lb (65.8 kg)   BMI 23.40 kg/m   Body mass index is 23.4 kg/m.  General appearance : Well developed well nourished female. No acute distress HEENT:  Eyes: no retinal hemorrhage or exudates,  Neck supple, trachea midline, no carotid bruits, no thyroidmegaly Lungs: Clear to auscultation, no rhonchi or wheezes, or rib retractions  Heart: Regular rate and rhythm, no murmurs or gallops Breast:Examined in sitting and supine position were symmetrical in appearance, no palpable masses or tenderness,  no skin retraction, no nipple inversion, no nipple discharge, no skin discoloration, no axillary or supraclavicular lymphadenopathy Abdomen: no palpable masses or tenderness, no rebound or guarding Extremities: no edema or skin discoloration or tenderness  Pelvic: Vulva: Normal             Vagina: No gross lesions or discharge  Cervix: No gross lesions or discharge  Uterus  AV, normal size, shape and consistency, non-tender and mobile  Adnexa  Without masses or tenderness  Anus: Normal   Assessment/Plan:  44 y.o. female for annual exam   1. Well female exam with routine gynecological exam Normal gynecologic exam.  Pap test June 2020 was negative with negative, no indication to repeat this year.  History of LEEP but more than 15 years ago and Pap tests negative since then.  Breast exam normal.  Screening mammogram October 2020 was negative.  Health labs with family physician.  Very good body mass index at 23.4.  Continue with fitness and healthy nutrition.  2. Encounter for surveillance of contraceptive pills Well on birth control pills with Junel 1/20 continuous use.  No breakthrough bleeding.  No contraindication to continue.  Prescription sent to pharmacy.  Other orders - norethindrone-ethinyl estradiol (  JUNEL 1/20) 1-20 MG-MCG tablet; TAKE 1 TABLET BY MOUTH DAILY FOR CONTINUOUS USE  Genia Del MD, 8:45 AM 07/10/2019

## 2019-11-06 DIAGNOSIS — Z23 Encounter for immunization: Secondary | ICD-10-CM | POA: Diagnosis not present

## 2019-12-14 ENCOUNTER — Other Ambulatory Visit: Payer: Self-pay | Admitting: Obstetrics & Gynecology

## 2019-12-14 DIAGNOSIS — Z1231 Encounter for screening mammogram for malignant neoplasm of breast: Secondary | ICD-10-CM

## 2019-12-18 ENCOUNTER — Ambulatory Visit: Payer: BC Managed Care – PPO | Admitting: Obstetrics & Gynecology

## 2019-12-18 ENCOUNTER — Other Ambulatory Visit: Payer: Self-pay

## 2019-12-18 ENCOUNTER — Encounter: Payer: Self-pay | Admitting: Obstetrics & Gynecology

## 2019-12-18 VITALS — BP 128/86

## 2019-12-18 DIAGNOSIS — N921 Excessive and frequent menstruation with irregular cycle: Secondary | ICD-10-CM | POA: Diagnosis not present

## 2019-12-18 NOTE — Progress Notes (Signed)
    Keeli [facility] May 07, 1975 [id number]        44 y.o.  G0P0000   RP: BTB x 2 weeks on continuous Junel 1/20  HPI: On continuous Junel 1/20 x many years.  Very good compliance.  Not on any other medication.  First episode of BTB with brownish spotting x 2 weeks.  Mild cramping.  No vaginal discharge otherwise.  Started exercising more recently.   OB History  Gravida Para Term Preterm AB Living  0 0 0 0 0 0  SAB TAB Ectopic Multiple Live Births  0 0 0 0 0    Past medical history,surgical history, problem list, medications, allergies, family history and social history were all reviewed and documented in the EPIC chart.   Directed ROS with pertinent positives and negatives documented in the history of present illness/assessment and plan.  Exam:  Vitals:   12/18/19 1156  BP: 128/86   General appearance:  Normal  Abdomen: Normal  Gynecologic exam: Vulva normal. Speculum:  Cervix normal, no polyp.  Mild dark blood from EO.  Vagina normal.  Bimanual exam:  Uterus AV, normal volume, NT, mobile.  No adnexal mass, NT.     Assessment/Plan:  44 y.o. G0  1. Breakthrough bleeding on birth control pills BTB x 2 weeks on continuous Junel 1/20.  Normal gyn exam.  Will complete the investigation with a Pelvic US to assess the endometrial line.  R/O Endometrial polyp/SM Myoma/endometrial hyperplasia and endometrial Ca.  Patient will stop the Junel 1/20 pill x 5 days and then restart.  We will decide per Pelvic US and clinical findings as to wether continue on the same BCP or change. - US Transvaginal Non-OB; Future  Genia Del MD, 12:10 PM 12/18/2019

## 2019-12-25 ENCOUNTER — Ambulatory Visit
Admission: RE | Admit: 2019-12-25 | Discharge: 2019-12-25 | Disposition: A | Discharge status: Home / Self Care | Payer: BC Managed Care – PPO | Source: Ambulatory Visit

## 2019-12-25 ENCOUNTER — Other Ambulatory Visit: Payer: Self-pay

## 2019-12-25 DIAGNOSIS — Z1231 Encounter for screening mammogram for malignant neoplasm of breast: Secondary | ICD-10-CM | POA: Diagnosis not present

## 2020-01-03 ENCOUNTER — Ambulatory Visit (INDEPENDENT_AMBULATORY_CARE_PROVIDER_SITE_OTHER): Payer: BC Managed Care – PPO

## 2020-01-03 ENCOUNTER — Other Ambulatory Visit: Payer: Self-pay

## 2020-01-03 ENCOUNTER — Encounter: Payer: Self-pay | Admitting: Obstetrics & Gynecology

## 2020-01-03 ENCOUNTER — Ambulatory Visit (INDEPENDENT_AMBULATORY_CARE_PROVIDER_SITE_OTHER): Payer: BC Managed Care – PPO | Admitting: Obstetrics & Gynecology

## 2020-01-03 VITALS — BP 136/88

## 2020-01-03 DIAGNOSIS — N921 Excessive and frequent menstruation with irregular cycle: Secondary | ICD-10-CM

## 2020-01-03 DIAGNOSIS — N854 Malposition of uterus: Secondary | ICD-10-CM | POA: Diagnosis not present

## 2020-01-03 NOTE — Progress Notes (Signed)
    Terri Wilkinson [facility] 09-19-1975 [id number]        44 y.o.  G0   RP: BTB x 2 weeks on continuous Junel 1/20 for Pelvic US  HPI: Since last visit, patient allowed a withdrawal bleeding x 5 days and then restarted the BCPs, no BTB x 1 week now.  On 12/18/2019 we noted:  On continuous Junel 1/20 x many years.  Very good compliance.  Not on any other medication.  First episode of BTB with brownish spotting x 2 weeks.  Mild cramping.  No vaginal discharge otherwise.  Started exercising more recently.   OB History  Gravida Para Term Preterm AB Living  0 0 0 0 0 0  SAB TAB Ectopic Multiple Live Births  0 0 0 0 0    Past medical history,surgical history, problem list, medications, allergies, family history and social history were all reviewed and documented in the EPIC chart.   Directed ROS with pertinent positives and negatives documented in the history of present illness/assessment and plan.  Exam:  Vitals:   01/03/20 0935  BP: 136/88   General appearance:  Normal  Pelvic US today: T/V images.  Anteverted uterus normal in size and shape with no myometrial mass.  The uterus is measured at 7.21 x 3.91 x 3.07 cm.  The endometrial line is thin measured at 2.64 mm.  Possible small calcification or clotted blood at the periphery which are avascular.  No mass or thickening seen.  Both ovaries are normal in size with normal follicular pattern.  No adnexal mass.  No free fluid in the posterior cul-de-sac.   Assessment/Plan:  44 y.o. G0  1. Breakthrough bleeding on birth control pills Resolved breakthrough bleeding on the birth control pill.  Pelvic ultrasound findings reviewed with patient.  Patient reassured that the endometrial lining is thin as 2.64 mm.  Possible small calcification not concerning given that they are avascular.  Continue on Junel 1/20.  Will allow a withdrawal bleeding if BTB recurs.  Genia Del MD, 10:02 AM 01/03/2020

## 2020-01-04 ENCOUNTER — Encounter: Payer: Self-pay | Admitting: Obstetrics & Gynecology

## 2020-01-15 ENCOUNTER — Ambulatory Visit: Payer: BC Managed Care – PPO | Attending: Internal Medicine

## 2020-01-15 ENCOUNTER — Other Ambulatory Visit (HOSPITAL_BASED_OUTPATIENT_CLINIC_OR_DEPARTMENT_OTHER): Payer: Self-pay | Admitting: Internal Medicine

## 2020-01-15 DIAGNOSIS — Z23 Encounter for immunization: Secondary | ICD-10-CM

## 2020-01-15 NOTE — Progress Notes (Signed)
   Covid-19 Vaccination Clinic  Name:  Terri Wilkinson    MRN: [id number] DOB: 02-11-1975  01/15/2020  [name] was observed post Covid-19 immunization for 15 minutes without incident. She was provided with Vaccine Information Sheet and instruction to access the V-Safe system.   [name] was instructed to call 911 with any severe reactions post vaccine: Marland Kitchen Difficulty breathing  . Swelling of face and throat  . A fast heartbeat  . A bad rash all over body  . Dizziness and weakness   Immunizations Administered    Name Date Dose VIS Date Route   Pfizer COVID-19 Vaccine 01/15/2020  2:34 PM 0.3 mL 11/21/2019 Intramuscular   Manufacturer: [employer], [employer]   Lot: [id number]   NDC: [id number]

## 2020-01-21 MED FILL — PFIZER-BIONTECH COVID-19 VA: 30 | 1 days supply | Qty: 0 | Fill #0

## 2020-02-08 DIAGNOSIS — M222X2 Patellofemoral disorders, left knee: Secondary | ICD-10-CM | POA: Diagnosis not present

## 2020-02-08 DIAGNOSIS — M1711 Unilateral primary osteoarthritis, right knee: Secondary | ICD-10-CM | POA: Diagnosis not present

## 2020-02-08 DIAGNOSIS — M222X1 Patellofemoral disorders, right knee: Secondary | ICD-10-CM | POA: Diagnosis not present

## 2020-05-13 DIAGNOSIS — Z0189 Encounter for other specified special examinations: Secondary | ICD-10-CM | POA: Diagnosis not present

## 2020-06-19 DIAGNOSIS — R790 Abnormal level of blood mineral: Secondary | ICD-10-CM | POA: Diagnosis not present

## 2020-07-10 ENCOUNTER — Other Ambulatory Visit: Payer: Self-pay

## 2020-07-10 ENCOUNTER — Encounter: Payer: Self-pay | Admitting: Obstetrics & Gynecology

## 2020-07-10 ENCOUNTER — Ambulatory Visit (INDEPENDENT_AMBULATORY_CARE_PROVIDER_SITE_OTHER): Payer: BC Managed Care – PPO | Admitting: Obstetrics & Gynecology

## 2020-07-10 VITALS — BP 122/78 | Ht 66.0 in | Wt 146.0 lb

## 2020-07-10 DIAGNOSIS — Z01419 Encounter for gynecological examination (general) (routine) without abnormal findings: Secondary | ICD-10-CM | POA: Diagnosis not present

## 2020-07-10 DIAGNOSIS — Z3041 Encounter for surveillance of contraceptive pills: Secondary | ICD-10-CM | POA: Diagnosis not present

## 2020-07-10 MED ORDER — NORETHINDRONE ACET-ETHINYL EST 1-20 MG-MCG PO TABS
ORAL_TABLET | ORAL | 4 refills | Status: DC
Start: 2020-07-10 — End: 2020-12-08

## 2020-07-10 NOTE — Progress Notes (Signed)
Terri Wilkinson [facility] December 23, 1975 [id number]   History:    45 y.o. G0 Married   RP:  Established patient presenting for annual gyn exam   HPI: Well on Junel 1/20 continuous use.  No breakthrough bleeding.  No pelvic pain.  No pain with intercourse.  Normal vaginal secretions.  Breasts normal.  Urine and bowel movements normal.  Body mass index good at 23.57.  Good fitness and healthy nutrition.  Will do Health labs with Fam MD.   Past medical history,surgical history, family history and social history were all reviewed and documented in the EPIC chart.  Gynecologic History No LMP recorded. (Menstrual status: Oral contraceptives).  Obstetric History OB History  Gravida Para Term Preterm AB Living  0 0 0 0 0 0  SAB IAB Ectopic Multiple Live Births  0 0 0 0 0     ROS: A ROS was performed and pertinent positives and negatives are included in the history.  GENERAL: No fevers or chills. HEENT: No change in vision, no earache, sore throat or sinus congestion. NECK: No pain or stiffness. CARDIOVASCULAR: No chest pain or pressure. No palpitations. PULMONARY: No shortness of breath, cough or wheeze. GASTROINTESTINAL: No abdominal pain, nausea, vomiting or diarrhea, melena or bright red blood per rectum. GENITOURINARY: No urinary frequency, urgency, hesitancy or dysuria. MUSCULOSKELETAL: No joint or muscle pain, no back pain, no recent trauma. DERMATOLOGIC: No rash, no itching, no lesions. ENDOCRINE: No polyuria, polydipsia, no heat or cold intolerance. No recent change in weight. HEMATOLOGICAL: No anemia or easy bruising or bleeding. NEUROLOGIC: No headache, seizures, numbness, tingling or weakness. PSYCHIATRIC: No depression, no loss of interest in normal activity or change in sleep pattern.     Exam:   BP 122/78 (BP Location: Right Arm, Patient Position: Sitting, Cuff Size: Normal)   Ht 5\' 6"  (1.676 m)   Wt 146 lb (66.2 kg)   BMI 23.57 kg/m   Body mass index is 23.57 kg/m.  General  appearance : Well developed well nourished female. No acute distress HEENT: Eyes: no retinal hemorrhage or exudates,  Neck supple, trachea midline, no carotid bruits, no thyroidmegaly Lungs: Clear to auscultation, no rhonchi or wheezes, or rib retractions  Heart: Regular rate and rhythm, no murmurs or gallops Breast:Examined in sitting and supine position were symmetrical in appearance, no palpable masses or tenderness,  no skin retraction, no nipple inversion, no nipple discharge, no skin discoloration, no axillary or supraclavicular lymphadenopathy Abdomen: no palpable masses or tenderness, no rebound or guarding Extremities: no edema or skin discoloration or tenderness  Pelvic: Vulva: Normal             Vagina: No gross lesions or discharge  Cervix: No gross lesions or discharge  Uterus  AV, normal size, shape and consistency, non-tender and mobile  Adnexa  Without masses or tenderness  Anus: Normal   Assessment/Plan:  45 y.o. female for annual exam   1. Well female exam with routine gynecological exam Normal gynecologic exam.  No indication for Pap test this year, Pap test negative in June 2020, will repeat at 3 years.  Breast exam normal.  Screening mammogram November 2021 was negative.  Health labs with family physician.  Good body mass index at 23.57.  Continue with fitness and healthy nutrition.  2. Encounter for surveillance of contraceptive pills Well on Junel 1/20.  No contraindication to continue.  Prescription sent to pharmacy.  Other orders - norethindrone-ethinyl estradiol (JUNEL 1/20) 1-20 MG-MCG tablet; TAKE 1 TABLET BY MOUTH  DAILY FOR CONTINUOUS USE   Genia Del MD, 4:08 PM 07/10/2020

## 2020-07-11 ENCOUNTER — Encounter: Payer: Self-pay | Admitting: Obstetrics & Gynecology

## 2020-08-08 DIAGNOSIS — K589 Irritable bowel syndrome without diarrhea: Secondary | ICD-10-CM | POA: Diagnosis not present

## 2020-08-08 DIAGNOSIS — K529 Noninfective gastroenteritis and colitis, unspecified: Secondary | ICD-10-CM | POA: Diagnosis not present

## 2020-11-11 ENCOUNTER — Ambulatory Visit: Payer: BC Managed Care – PPO | Admitting: Obstetrics & Gynecology

## 2020-11-12 ENCOUNTER — Other Ambulatory Visit: Payer: Self-pay | Admitting: Obstetrics & Gynecology

## 2020-11-12 DIAGNOSIS — Z1231 Encounter for screening mammogram for malignant neoplasm of breast: Secondary | ICD-10-CM

## 2020-12-08 MED ORDER — NORETHINDRONE ACET-ETHINYL EST 1-20 MG-MCG PO TABS: ORAL_TABLET | ORAL | 2 refills | Status: DC

## 2021-01-02 ENCOUNTER — Other Ambulatory Visit: Payer: Self-pay

## 2021-01-02 ENCOUNTER — Ambulatory Visit
Admission: RE | Admit: 2021-01-02 | Discharge: 2021-01-02 | Disposition: A | Discharge status: Home / Self Care | Payer: No Typology Code available for payment source | Source: Ambulatory Visit

## 2021-01-02 DIAGNOSIS — Z1231 Encounter for screening mammogram for malignant neoplasm of breast: Secondary | ICD-10-CM

## 2021-07-14 ENCOUNTER — Other Ambulatory Visit (HOSPITAL_COMMUNITY)
Admission: RE | Admit: 2021-07-14 | Discharge: 2021-07-14 | Disposition: A | Discharge status: Home / Self Care | Payer: No Typology Code available for payment source | Source: Ambulatory Visit | Attending: Obstetrics & Gynecology | Admitting: Obstetrics & Gynecology

## 2021-07-14 ENCOUNTER — Encounter: Payer: Self-pay | Admitting: Obstetrics & Gynecology

## 2021-07-14 ENCOUNTER — Ambulatory Visit (INDEPENDENT_AMBULATORY_CARE_PROVIDER_SITE_OTHER): Payer: No Typology Code available for payment source | Admitting: Obstetrics & Gynecology

## 2021-07-14 VITALS — BP 114/68 | HR 92 | Ht 65.5 in | Wt 153.0 lb

## 2021-07-14 DIAGNOSIS — Z01419 Encounter for gynecological examination (general) (routine) without abnormal findings: Secondary | ICD-10-CM | POA: Insufficient documentation

## 2021-07-14 DIAGNOSIS — Z3041 Encounter for surveillance of contraceptive pills: Secondary | ICD-10-CM

## 2021-07-14 MED ORDER — NORETHINDRONE ACET-ETHINYL EST 1-20 MG-MCG PO TABS: ORAL_TABLET | ORAL | 5 refills | Status: DC

## 2021-07-14 NOTE — Progress Notes (Signed)
Terri Wilkinson Oct 19, 1975 [id number]   History:    46 y.o. G0 Married   RP:  Established patient presenting for annual gyn exam   HPI: Well on Junel 1/20 continuous use.  No breakthrough bleeding.  No pelvic pain.  No pain with intercourse.  Normal vaginal secretions. Last Pap Neg 07/2018.  Pap reflex today. Breasts normal.  Mammo Neg 01/2021.  Urine and bowel movements normal. Body mass index good at 25.07.  Good fitness, biking, and healthy nutrition.  Will do Health labs with Fam MD. Terri Wilkinson 2019.    Past medical history,surgical history, family history and social history were all reviewed and documented in the EPIC chart.  Gynecologic History No LMP recorded. (Menstrual status: Oral contraceptives).  Obstetric History OB History  Gravida Para Term Preterm AB Living  0 0 0 0 0 0  SAB IAB Ectopic Multiple Live Births  0 0 0 0 0     ROS: A ROS was performed and pertinent positives and negatives are included in the history. GENERAL: No fevers or chills. HEENT: No change in vision, no earache, sore throat or sinus congestion. NECK: No pain or stiffness. CARDIOVASCULAR: No chest pain or pressure. No palpitations. PULMONARY: No shortness of breath, cough or wheeze. GASTROINTESTINAL: No abdominal pain, nausea, vomiting or diarrhea, melena or bright red blood per rectum. GENITOURINARY: No urinary frequency, urgency, hesitancy or dysuria. MUSCULOSKELETAL: No joint or muscle pain, no back pain, no recent trauma. DERMATOLOGIC: No rash, no itching, no lesions. ENDOCRINE: No polyuria, polydipsia, no heat or cold intolerance. No recent change in weight. HEMATOLOGICAL: No anemia or easy bruising or bleeding. NEUROLOGIC: No headache, seizures, numbness, tingling or weakness. PSYCHIATRIC: No depression, no loss of interest in normal activity or change in sleep pattern.     Exam:   BP 114/68   Pulse 92   Ht 5' 5.5" (1.664 m)   Wt 153 lb (69.4 kg)   SpO2 99%   BMI 25.07 kg/m   Body mass index  is 25.07 kg/m.  General appearance : Well developed well nourished female. No acute distress HEENT: Eyes: no retinal hemorrhage or exudates,  Neck supple, trachea midline, no carotid bruits, no thyroidmegaly Lungs: Clear to auscultation, no rhonchi or wheezes, or rib retractions  Heart: Regular rate and rhythm, no murmurs or gallops Breast:Examined in sitting and supine position were symmetrical in appearance, no palpable masses or tenderness,  no skin retraction, no nipple inversion, no nipple discharge, no skin discoloration, no axillary or supraclavicular lymphadenopathy Abdomen: no palpable masses or tenderness, no rebound or guarding Extremities: no edema or skin discoloration or tenderness  Pelvic: Vulva: Normal             Vagina: No gross lesions or discharge  Cervix: No gross lesions or discharge.  Pap reflex done.  Uterus  AV, normal size, shape and consistency, non-tender and mobile  Adnexa  Without masses or tenderness  Anus: Normal   Assessment/Plan:  46 y.o. female for annual exam   1. Encounter for routine gynecological examination with Papanicolaou smear of cervix Well on Junel 1/20 continuous use.  No breakthrough bleeding.  No pelvic pain.  No pain with intercourse.  Normal vaginal secretions. Last Pap Neg 07/2018.  Pap reflex today. Breasts normal.  Mammo Neg 01/2021.  Urine and bowel movements normal. Body mass index good at 25.07.  Good fitness, biking, and healthy nutrition.  Will do Health labs with Fam MD. Terri Wilkinson 2019. - Cytology - PAP( [place])  2. Encounter for surveillance of contraceptive pills Well on Junel 1/20 continuous use.  No breakthrough bleeding.  No pelvic pain.  No pain with intercourse.  Normal vaginal secretions.  No CI to BCPs.  Junel 1/20 continuous use prescription sent to pharmacy.  Other orders - Loperamide HCl (IMODIUM PO); Take by mouth. - norethindrone-ethinyl estradiol (JUNEL 1/20) 1-20 MG-MCG tablet; TAKE 1 TABLET BY MOUTH DAILY FOR  CONTINUOUS USE   Genia Del MD, 1:34 PM 07/14/2021

## 2021-07-15 LAB — CYTOLOGY - PAP: Diagnosis: NEGATIVE

## 2021-08-28 ENCOUNTER — Encounter: Payer: Self-pay | Admitting: Obstetrics & Gynecology

## 2022-01-05 ENCOUNTER — Other Ambulatory Visit: Payer: Self-pay | Admitting: Obstetrics & Gynecology

## 2022-01-05 DIAGNOSIS — Z1231 Encounter for screening mammogram for malignant neoplasm of breast: Secondary | ICD-10-CM

## 2022-03-01 ENCOUNTER — Ambulatory Visit
Admission: RE | Admit: 2022-03-01 | Discharge: 2022-03-01 | Disposition: A | Discharge status: Home / Self Care | Payer: No Typology Code available for payment source | Source: Ambulatory Visit | Attending: Obstetrics & Gynecology | Admitting: Obstetrics & Gynecology

## 2022-03-01 DIAGNOSIS — Z1231 Encounter for screening mammogram for malignant neoplasm of breast: Secondary | ICD-10-CM

## 2022-04-22 ENCOUNTER — Ambulatory Visit: Payer: No Typology Code available for payment source | Admitting: Allergy

## 2022-04-22 ENCOUNTER — Encounter: Payer: Self-pay | Admitting: Allergy

## 2022-04-22 ENCOUNTER — Other Ambulatory Visit: Payer: Self-pay

## 2022-04-22 VITALS — BP 122/76 | HR 84 | Temp 98.1°F | Resp 14 | Ht 66.0 in | Wt 153.8 lb

## 2022-04-22 DIAGNOSIS — L299 Pruritus, unspecified: Secondary | ICD-10-CM

## 2022-04-22 DIAGNOSIS — L509 Urticaria, unspecified: Secondary | ICD-10-CM | POA: Diagnosis not present

## 2022-04-22 NOTE — Progress Notes (Signed)
New Patient Note  RE: TERITA KLEMME MRN: [id number] DOB: Aug 24, 1975 Date of Office Visit: 04/22/2022   Primary care provider: Jonathon Jordan, MD  Chief Complaint: allergic reaction  History of present illness: Terri Wilkinson is a 47 y.o. female presenting today for evaluation of allergic reaction.    She states about 2 weeks ago she became very itchy and she started scratching her legs and developed a rash on her legs.  She used cortisone that helped the itch.  The rash persisted until about 2 days ago.  She did take benadryl several times.  She states she did get itchy on her neck and face.  She does report she felt like the skin on the side of her eyes felt a bit full and puffy.  The rash was red and raised slightly.  No heater use, no new foods, no new medications or dose changes, no stings/bites.  No preceding illnesses.  She states she did change laundry detergent thus she thought it was that as the cause of the rash and she went back to her old detergent and rewashed everything but this didn't seem to help.  She otherwise has no idea what triggered this rash. She has not had hives before.   She states she has not had any issues with seasonal allergens before.  No history of asthma or eczema.    Review of systems in the past 4 weeks: Review of Systems  Constitutional: Negative.   HENT: Negative.    Eyes: Negative.   Respiratory: Negative.    Cardiovascular: Negative.   Gastrointestinal: Negative.   Musculoskeletal: Negative.   Skin:  Positive for rash.  Allergic/Immunologic: Negative.   Neurological: Negative.     All other systems negative unless noted above in HPI  Past medical history: History reviewed. No pertinent past medical history.  Past surgical history: History reviewed. No pertinent surgical history.  Family history:  Family History  Problem Relation Age of Onset   Ulcerative colitis Sister    Cancer Paternal Uncle        prostate    Social  history: Lives in a home with carpeting in the bedroom with gas heating and central cooling.  Cat in the home.  She is a Government social research officer.  Denies smoking/vaping.  Tobacco Use   Smoking status: Former    Types: Cigarettes    Quit date: 04/22/2008    Years since quitting: 14.0    Passive exposure: Past   Smokeless tobacco: Never  Vaping Use   Vaping Use: Never used    Medication List: Current Outpatient Medications  Medication Sig Dispense Refill   Loperamide HCl (IMODIUM PO) Take by mouth.     Multiple Vitamin (MULTIVITAMIN) tablet Take 1 tablet by mouth daily.     norethindrone-ethinyl estradiol (JUNEL 1/20) 1-20 MG-MCG tablet TAKE 1 TABLET BY MOUTH DAILY FOR CONTINUOUS USE 84 tablet 5   No current facility-administered medications for this visit.    Known medication allergies: No Known Allergies   Physical examination: Blood pressure 122/76, pulse 84, temperature 98.1 F (36.7 C), temperature source Temporal, resp. rate 14, height 5\' 6"  (1.676 m), weight 153 lb 12.8 oz (69.8 kg), SpO2 97 %.  General: Alert, interactive, in no acute distress. HEENT: PERRLA, TMs pearly gray, turbinates non-edematous without discharge, post-pharynx non erythematous. Neck: Supple without lymphadenopathy. Lungs: Clear to auscultation without wheezing, rhonchi or rales. {no increased work of breathing. CV: Normal S1, S2 without murmurs. Abdomen: Nondistended, nontender. Skin: Warm  and dry, without lesions or rashes. Extremities:  No clubbing, cyanosis or edema. Neuro:   Grossly intact.  Diagnositics/Labs: None today due to recent urticaria  Assessment and plan: Urticaria Pruritus  - at this time etiology of hives and swelling is unknown and most likely spontaneous in nature.  Hives can be caused by a variety of different triggers including illness/infection, foods, medications, stings, exercise, pressure, vibrations, extremes of temperature to name a few however majority of the time there is  no identifiable trigger.   - will obtain following labwork for hive evaluation: CBC w diff, CMP, tryptase, hive panel, environmental panel, alpha-gal panel, inflammatory markers  - should significant symptoms recur or new symptoms occur, a journal is to be kept recording any foods eaten, beverages consumed, medications taken, activities performed, and environmental conditions within a 24 hour time period prior to the onset of symptoms.   - should hives return recommend taking antihistamine regimen: Zyrtec 10mg  1 tab with Pepcid 20mg  1 tab daily.   Can increase both to twice daily dosing if hives not controlled on daily dosing.  If twice daily dosing is not effective enough then would consider Xolair monthly injections for spontaneous hive management.    Follow-up pending labs  I appreciate the opportunity to take part in Suleika's care. Please do not hesitate to contact me with questions.  Sincerely,   Prudy Feeler, MD Allergy/Immunology [facility]

## 2022-04-22 NOTE — Patient Instructions (Addendum)
Hives  - at this time etiology of hives and swelling is unknown and most likely spontaneous in nature.  Hives can be caused by a variety of different triggers including illness/infection, foods, medications, stings, exercise, pressure, vibrations, extremes of temperature to name a few however majority of the time there is no identifiable trigger.   - will obtain following labwork for hive evaluation: CBC w diff, CMP, tryptase, hive panel, environmental panel, alpha-gal panel, inflammatory markers  - should significant symptoms recur or new symptoms occur, a journal is to be kept recording any foods eaten, beverages consumed, medications taken, activities performed, and environmental conditions within a 24 hour time period prior to the onset of symptoms.   - should hives return recommend taking antihistamine regimen: Zyrtec 10mg  1 tab with Pepcid 20mg  1 tab daily.   Can increase both to twice daily dosing if hives not controlled on daily dosing.  If twice daily dosing is not effective enough then would consider Xolair monthly injections for spontaneous hive management.    Follow-up pending labs

## 2022-04-28 LAB — CBC WITH DIFFERENTIAL
Basophils Absolute: 0 10*3/uL (ref 0.0–0.2)
Basos: 0 %
EOS (ABSOLUTE): 0.1 10*3/uL (ref 0.0–0.4)
Eos: 2 %
Hematocrit: 44.4 % (ref 34.0–46.6)
Hemoglobin: 14.6 g/dL (ref 11.1–15.9)
Immature Grans (Abs): 0 10*3/uL (ref 0.0–0.1)
Immature Granulocytes: 0 %
Lymphocytes Absolute: 1.1 10*3/uL (ref 0.7–3.1)
Lymphs: 27 %
MCH: 30.5 pg (ref 26.6–33.0)
MCHC: 32.9 g/dL (ref 31.5–35.7)
MCV: 93 fL (ref 79–97)
Monocytes Absolute: 0.2 10*3/uL (ref 0.1–0.9)
Monocytes: 5 %
Neutrophils Absolute: 2.6 10*3/uL (ref 1.4–7.0)
Neutrophils: 66 %
RBC: 4.79 x10E6/uL (ref 3.77–5.28)
RDW: 12.2 % (ref 11.7–15.4)
WBC: 4 10*3/uL (ref 3.4–10.8)

## 2022-04-28 LAB — THYROID ANTIBODIES
Thyroglobulin Antibody: <1 IU/mL (ref 0.0–0.9)
Thyroperoxidase Ab SerPl-aCnc: 14 IU/mL (ref 0–34)

## 2022-04-28 LAB — TRYPTASE: Tryptase: 2.9 ug/L (ref 2.2–13.2)

## 2022-04-28 LAB — COMPREHENSIVE METABOLIC PANEL
ALT: 20 IU/L (ref 0–32)
AST: 19 IU/L (ref 0–40)
Albumin/Globulin Ratio: 1.8 (ref 1.2–2.2)
Albumin: 4.5 g/dL (ref 3.9–4.9)
Alkaline Phosphatase: 40 IU/L — ABNORMAL LOW (ref 44–121)
BUN/Creatinine Ratio: 14 (ref 9–23)
BUN: 10 mg/dL (ref 6–24)
Bilirubin Total: 0.5 mg/dL (ref 0.0–1.2)
CO2: 21 mmol/L (ref 20–29)
Calcium: 9.4 mg/dL (ref 8.7–10.2)
Chloride: 101 mmol/L (ref 96–106)
Creatinine, Ser: 0.73 mg/dL (ref 0.57–1.00)
Globulin, Total: 2.5 g/dL (ref 1.5–4.5)
Glucose: 89 mg/dL (ref 70–99)
Potassium: 4.3 mmol/L (ref 3.5–5.2)
Sodium: 138 mmol/L (ref 134–144)
Total Protein: 7 g/dL (ref 6.0–8.5)
eGFR: 102 mL/min/{1.73_m2} (ref >59)

## 2022-04-28 LAB — ALLERGENS W/TOTAL IGE AREA 2

## 2022-04-28 LAB — CHRONIC URTICARIA: cu index: 4 (ref <10)

## 2022-04-28 LAB — TSH: TSH: 1.51 u[IU]/mL (ref 0.450–4.500)

## 2022-04-28 LAB — ALPHA-GAL PANEL
Allergen Lamb IgE: <0.1 kU/L
Beef IgE: <0.1 kU/L
IgE (Immunoglobulin E), Serum: 80 IU/mL (ref 6–495)
O215-IgE Alpha-Gal: <0.1 kU/L
Pork IgE: <0.1 kU/L

## 2022-07-22 ENCOUNTER — Encounter: Payer: Self-pay | Admitting: Obstetrics & Gynecology

## 2022-07-22 ENCOUNTER — Ambulatory Visit: Payer: No Typology Code available for payment source | Admitting: Obstetrics & Gynecology

## 2022-07-22 VITALS — BP 110/72 | HR 85 | Ht 66.0 in | Wt 154.0 lb

## 2022-07-22 DIAGNOSIS — Z3041 Encounter for surveillance of contraceptive pills: Secondary | ICD-10-CM | POA: Diagnosis not present

## 2022-07-22 DIAGNOSIS — Z01419 Encounter for gynecological examination (general) (routine) without abnormal findings: Secondary | ICD-10-CM | POA: Diagnosis not present

## 2022-07-22 MED ORDER — NORETHINDRONE ACET-ETHINYL EST 1-20 MG-MCG PO TABS: ORAL_TABLET | ORAL | 5 refills | Status: DC

## 2022-07-22 NOTE — Progress Notes (Signed)
Terri Wilkinson [facility] 03-29-1975 [id number]   History:    47 y.o. G0 Married   RP:  Established patient presenting for annual gyn exam   HPI: Well on Junel 1/20 continuous use.  No breakthrough bleeding.  No pelvic pain. No pain with intercourse.  Normal vaginal secretions. Last Pap Neg 07/2021.  Repeat Pap at 3 years. Breasts normal.  Mammo Neg 02/2022.  Urine and bowel movements normal. Colono 2019. Body mass index good at 24.86.  Good fitness, biking, and healthy nutrition.  Will do Health labs with Fam MD.   Past medical history,surgical history, family history and social history were all reviewed and documented in the EPIC chart.  Gynecologic History No LMP recorded. (Menstrual status: Oral contraceptives).  Obstetric History OB History  Gravida Para Term Preterm AB Living  0 0 0 0 0 0  SAB IAB Ectopic Multiple Live Births  0 0 0 0 0     ROS: A ROS was performed and pertinent positives and negatives are included in the history. GENERAL: No fevers or chills. HEENT: No change in vision, no earache, sore throat or sinus congestion. NECK: No pain or stiffness. CARDIOVASCULAR: No chest pain or pressure. No palpitations. PULMONARY: No shortness of breath, cough or wheeze. GASTROINTESTINAL: No abdominal pain, nausea, vomiting or diarrhea, melena or bright red blood per rectum. GENITOURINARY: No urinary frequency, urgency, hesitancy or dysuria. MUSCULOSKELETAL: No joint or muscle pain, no back pain, no recent trauma. DERMATOLOGIC: No rash, no itching, no lesions. ENDOCRINE: No polyuria, polydipsia, no heat or cold intolerance. No recent change in weight. HEMATOLOGICAL: No anemia or easy bruising or bleeding. NEUROLOGIC: No headache, seizures, numbness, tingling or weakness. PSYCHIATRIC: No depression, no loss of interest in normal activity or change in sleep pattern.     Exam:   BP 110/72   Pulse 85   Ht 5\' 6"  (1.676 m)   Wt 154 lb (69.9 kg)   SpO2 99%   BMI 24.86 kg/m   Body mass index  is 24.86 kg/m.  General appearance : Well developed well nourished female. No acute distress HEENT: Eyes: no retinal hemorrhage or exudates,  Neck supple, trachea midline, no carotid bruits, no thyroidmegaly Lungs: Clear to auscultation, no rhonchi or wheezes, or rib retractions  Heart: Regular rate and rhythm, no murmurs or gallops Breast:Examined in sitting and supine position were symmetrical in appearance, no palpable masses or tenderness,  no skin retraction, no nipple inversion, no nipple discharge, no skin discoloration, no axillary or supraclavicular lymphadenopathy Abdomen: no palpable masses or tenderness, no rebound or guarding Extremities: no edema or skin discoloration or tenderness  Pelvic: Vulva: Normal             Vagina: No gross lesions or discharge  Cervix: No gross lesions or discharge  Uterus  AV, normal size, shape and consistency, non-tender and mobile  Adnexa  Without masses or tenderness  Anus: Normal   Assessment/Plan:  47 y.o. female for annual exam   1. Well female exam with routine gynecological exam Well on Junel 1/20 continuous use.  No breakthrough bleeding.  No pelvic pain. No pain with intercourse.  Normal vaginal secretions. Last Pap Neg 07/2021.  Repeat Pap at 3 years. Breasts normal.  Mammo Neg 02/2022.  Urine and bowel movements normal. Colono 2019. Body mass index good at 24.86.  Good fitness, biking, and healthy nutrition.  Will do Health labs with Fam MD.   2. Encounter for surveillance of contraceptive pills Well on Junel 1/20  continuous use.  No breakthrough bleeding.  No pelvic pain. No pain with intercourse. No CI to continue on BCPs at this time.  Prescription sent to pharmacy. Discussion about switching to a Progestin only contraceptive such as a Progesterone IUD, Nexplanon, Progestin BCP around 48 yo.  Other orders - norethindrone-ethinyl estradiol (JUNEL 1/20) 1-20 MG-MCG tablet; TAKE 1 TABLET BY MOUTH DAILY FOR CONTINUOUS USE   Genia Del MD, 9:12 AM

## 2023-03-31 IMAGING — MG MM DIGITAL SCREENING BILAT W/ TOMO AND CAD
8 series · 9 of 24 positions shown · non-contrast
Comparison: Previous exam(s).

CLINICAL DATA: Screening.

EXAM:
DIGITAL SCREENING BILATERAL MAMMOGRAM WITH TOMOSYNTHESIS AND CAD
TECHNIQUE: Bilateral screening digital craniocaudal and mediolateral oblique
mammograms were obtained. Bilateral screening digital breast
tomosynthesis was performed. The images were evaluated with
computer-aided detection.

[R MLO synth-2D]
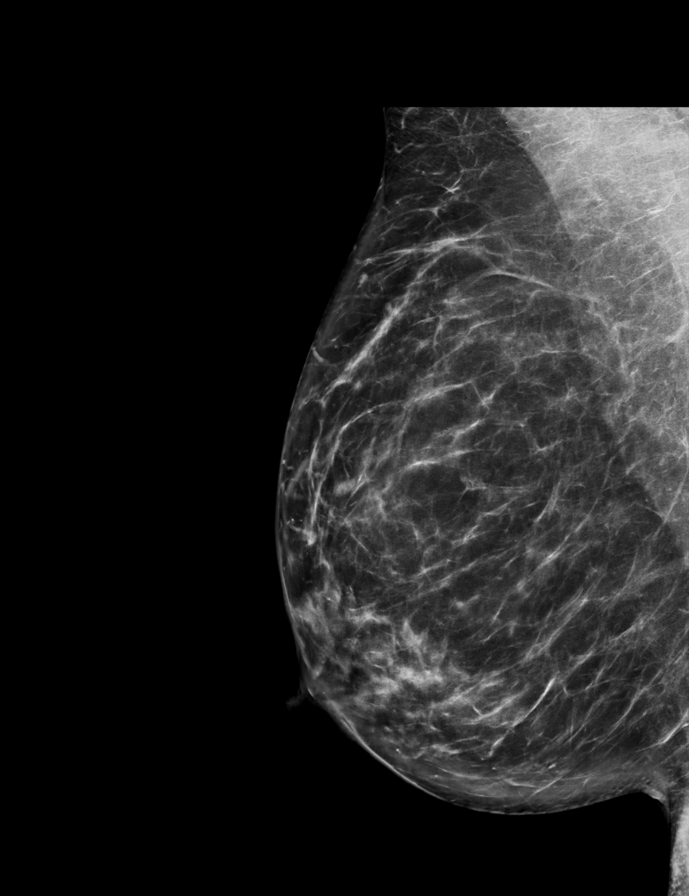

[L MLO synth-2D]
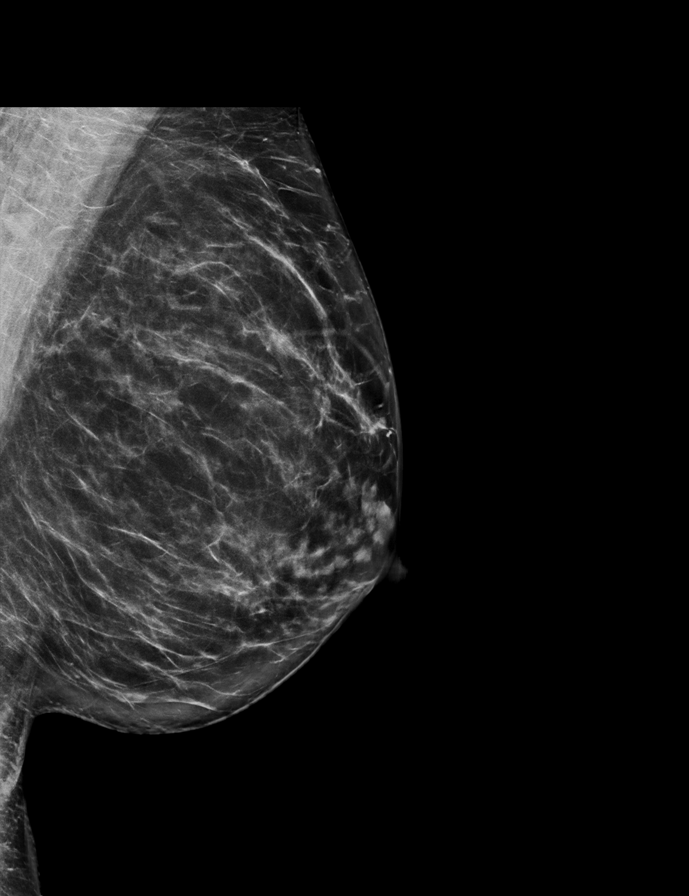

[R CC synth-2D]
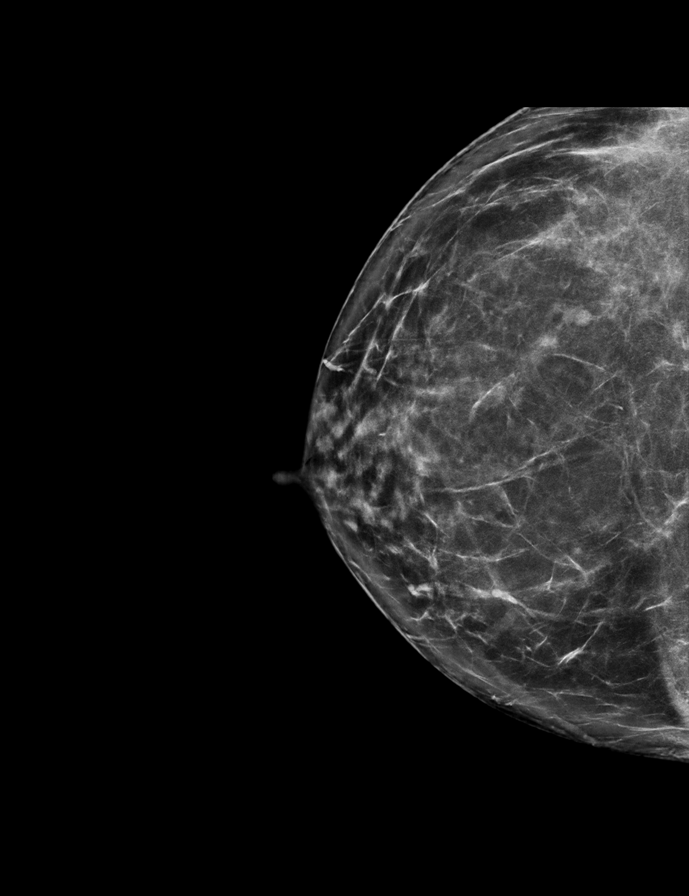

[L CC synth-2D]
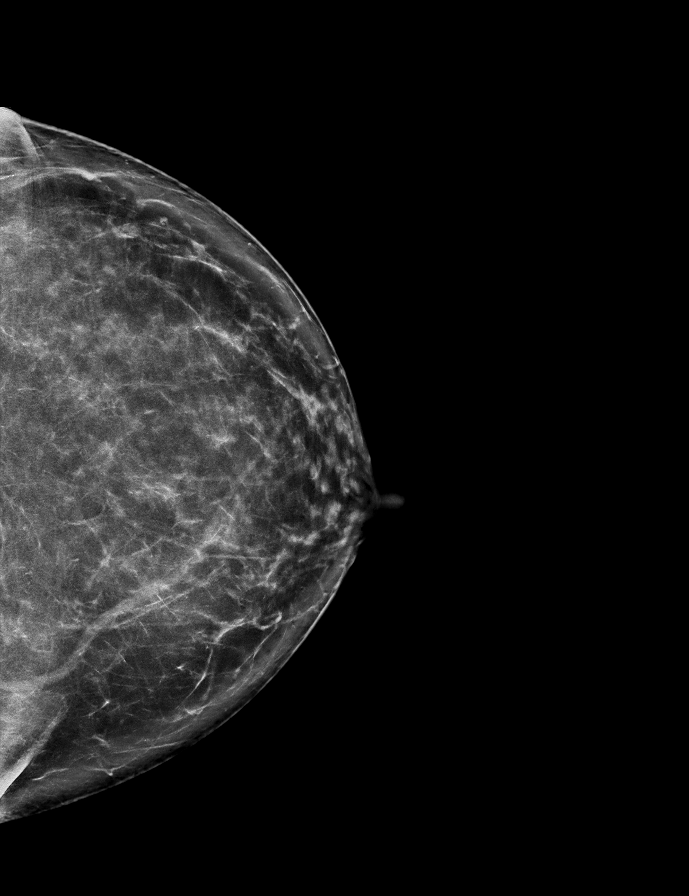

[R MLO tomo · 2 of 80 frames shown]
[frame 26/80]
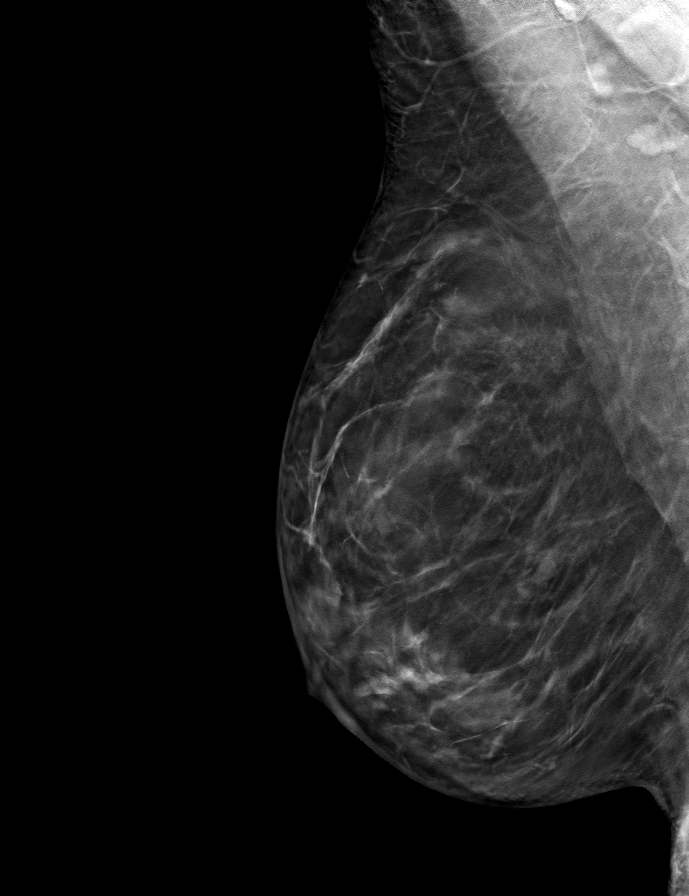
[frame 41/80]
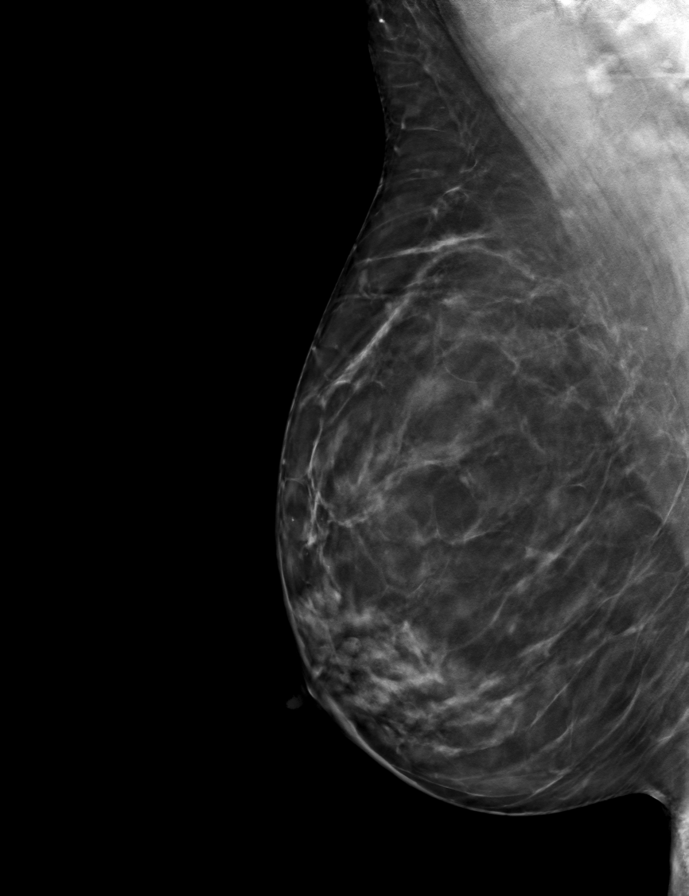

[R CC tomo · tomo slice 40/79.0]
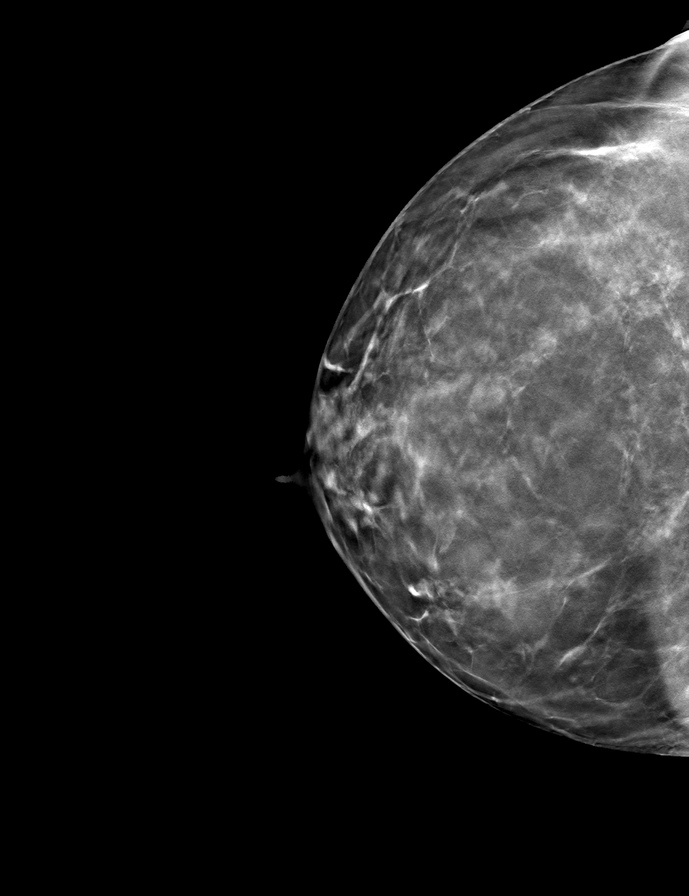

[L MLO tomo · tomo slice 41/80.0]
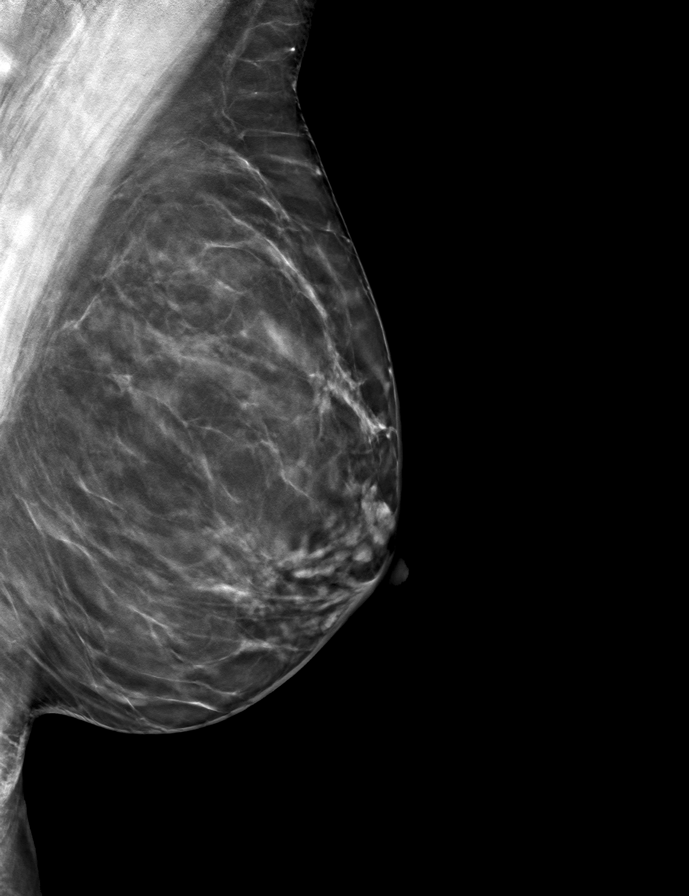

[L CC tomo · tomo slice 41/82.0]
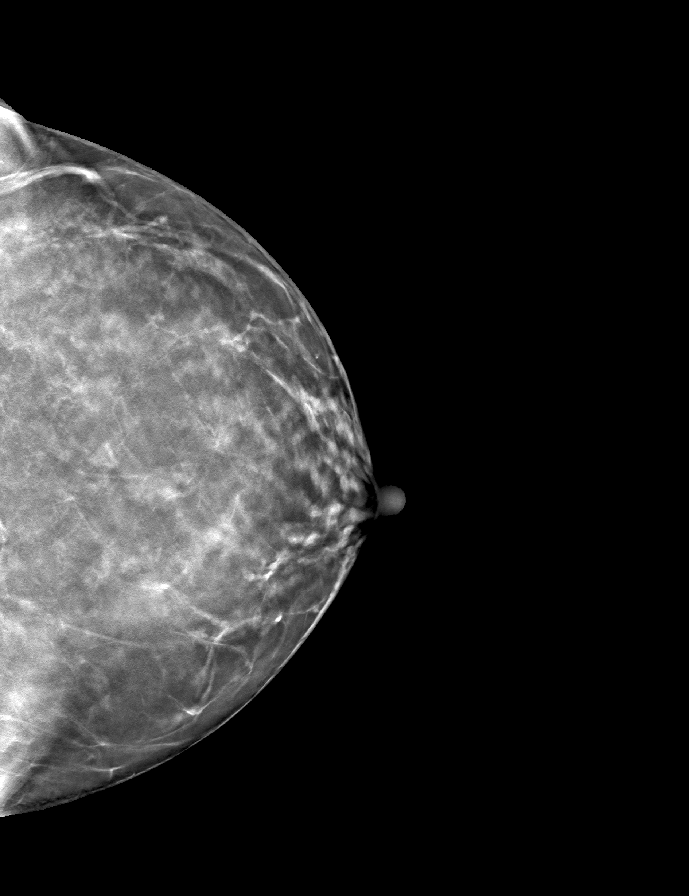

[9 of 24 positions shown; findings below may reference images not displayed]

ACR Breast Density Category b: There are scattered areas of
fibroglandular density.
FINDINGS: There are no findings suspicious for malignancy.
IMPRESSION: No mammographic evidence of malignancy. A result letter of this
screening mammogram will be mailed directly to the patient.

RECOMMENDATION:
Screening mammogram in one year. (Code:51-O-LD2)

BI-RADS CATEGORY  1: Negative.

## 2023-04-01 ENCOUNTER — Other Ambulatory Visit: Payer: Self-pay | Admitting: Family Medicine

## 2023-04-01 DIAGNOSIS — Z1231 Encounter for screening mammogram for malignant neoplasm of breast: Secondary | ICD-10-CM

## 2023-04-06 ENCOUNTER — Ambulatory Visit
Admission: RE | Admit: 2023-04-06 | Discharge: 2023-04-06 | Disposition: A | Discharge status: Home / Self Care | Payer: No Typology Code available for payment source | Source: Ambulatory Visit | Attending: Family Medicine | Admitting: Family Medicine

## 2023-04-06 DIAGNOSIS — Z1231 Encounter for screening mammogram for malignant neoplasm of breast: Secondary | ICD-10-CM

## 2023-10-12 ENCOUNTER — Other Ambulatory Visit: Payer: Self-pay

## 2023-10-12 MED ORDER — NORETHINDRONE ACET-ETHINYL EST 1-20 MG-MCG PO TABS: ORAL_TABLET | ORAL | 5 refills | Status: DC

## 2023-10-12 NOTE — Telephone Encounter (Signed)
*  Former Pt of Dr. Lavoie  Pt is asking if she can have a refill, just until she comes in for her annual with Dr. Glennon?  Med refill request: Junel 1/20 (1-20 mg/mcg tablet) -  Dispense: #84 tablets with 5 refills - Started on: 07/22/22  Last AEX: 07/22/22 Next AEX:  12/08/23 with Dr. Glennon Last MMG (if hormonal med): 04/06/23 Refill authorized? Please Advise.

## 2023-12-08 ENCOUNTER — Encounter: Payer: Self-pay | Admitting: Obstetrics and Gynecology

## 2023-12-08 ENCOUNTER — Other Ambulatory Visit: Payer: Self-pay | Admitting: Obstetrics and Gynecology

## 2023-12-08 ENCOUNTER — Ambulatory Visit (INDEPENDENT_AMBULATORY_CARE_PROVIDER_SITE_OTHER): Payer: Self-pay | Admitting: Obstetrics and Gynecology

## 2023-12-08 ENCOUNTER — Other Ambulatory Visit (HOSPITAL_COMMUNITY)
Admission: RE | Admit: 2023-12-08 | Discharge: 2023-12-08 | Disposition: A | Discharge status: Home / Self Care | Source: Ambulatory Visit | Attending: Obstetrics and Gynecology | Admitting: Obstetrics and Gynecology

## 2023-12-08 VITALS — BP 122/80 | HR 101 | Ht 65.75 in | Wt 158.0 lb

## 2023-12-08 DIAGNOSIS — Z800 Family history of malignant neoplasm of digestive organs: Secondary | ICD-10-CM

## 2023-12-08 DIAGNOSIS — Z01419 Encounter for gynecological examination (general) (routine) without abnormal findings: Secondary | ICD-10-CM | POA: Insufficient documentation

## 2023-12-08 DIAGNOSIS — Z1331 Encounter for screening for depression: Secondary | ICD-10-CM

## 2023-12-08 DIAGNOSIS — K588 Other irritable bowel syndrome: Secondary | ICD-10-CM

## 2023-12-08 DIAGNOSIS — E2839 Other primary ovarian failure: Secondary | ICD-10-CM | POA: Diagnosis not present

## 2023-12-08 MED ORDER — BIJUVA 0.5-100 MG PO CAPS: 1.0000 | ORAL_CAPSULE | Freq: Every day | ORAL | 3 refills | Status: DC

## 2023-12-08 MED ORDER — PROGESTERONE MICRONIZED 100 MG PO CAPS: 100.0000 mg | ORAL_CAPSULE | Freq: Every day | ORAL | 6 refills | Status: DC

## 2023-12-08 MED ORDER — ESTRADIOL 0.05 MG/24HR TD PTTW: 1.0000 | MEDICATED_PATCH | TRANSDERMAL | 12 refills | Status: AC

## 2023-12-08 NOTE — Patient Instructions (Addendum)
 Perimenopause/Menopause suggestions   You should be getting 1,200 mg of calcium a day between your diet and supplements and at least 3000 IU a day of Vit D. You should exercise regularly with weight bearing exercises. I would recommend a bone density q 2 years.  We loose 5% per year in this time period of our bone density, due to the loss of estrogen. Magnesium at bedtime for our sleep and bones (follow recommended dose on bottle)  Please read The New Menopause my Dr. Ronal Estefana Morton and Estrogen Matters: both books are on [facility]  Try to avoid caffeine and alcohol in this period, as they can make symptoms worse  Continue annual mammograms, unless told sooner  Please reach out with any questions Terri Wilkinson Terri Wilkinson   Locations you can schedule your bone scan are:  The [facility] bone scan location scheduling line is [phone]  [facility] is [phone]  [facility] radiology [phone]   [facility] [phone]  [facility] [name]: [phone]    Another option if they are behind is [facility] and their number is [phone].  I can send the referral if you want to go there.    Please let me know if you have any trouble scheduling. This is important for management of osteoporosis or osteopenia.   I can sit down with you after the scan to discuss results and treatment.  Dr. Carpen

## 2023-12-08 NOTE — Progress Notes (Signed)
 48 y.o. y.o. female here for annual exam. No LMP recorded. (Menstrual status: Oral contraceptives).    G0 Married   RP:  Established patient presenting for annual gyn exam   HPI: Well on Junel 1/20 continuous use 12/08/23 change to MHRT bijuva 0.5/100 qhs.   No pelvic pain. No pain with intercourse.  Normal vaginal secretions. Last Pap Neg 07/2021.  Repeat Pap at 2-3 years. Breasts normal.  Mammo Neg 3/25.  Urine and bowel movements normal. Colono 2019. Body mass index good at 24.86.  Good fitness, biking, and healthy nutrition.  Will do Health labs with Fam MD.  Dxa: referral placed. Has IBS and low BMI. No fractures Colon: 2019. Has IBS and family history of colon cancer  Body mass index is 25.7 kg/m.     12/08/2023    8:05 AM  Depression screen PHQ 2/9  Decreased Interest 0  Down, Depressed, Hopeless 0  PHQ - 2 Score 0    Blood pressure 122/80, pulse (!) 101, height 5' 5.75 (1.67 m), weight 158 lb (71.7 kg), SpO2 99%.     Component Value Date/Time   DIAGPAP  07/14/2021 1350    - Negative for intraepithelial lesion or malignancy (NILM)   ADEQPAP  07/14/2021 1350    Satisfactory for evaluation; transformation zone component PRESENT.    GYN HISTORY:    Component Value Date/Time   DIAGPAP  07/14/2021 1350    - Negative for intraepithelial lesion or malignancy (NILM)   ADEQPAP  07/14/2021 1350    Satisfactory for evaluation; transformation zone component PRESENT.    OB History  Gravida Para Term Preterm AB Living  0 0 0 0 0 0  SAB IAB Ectopic Multiple Live Births  0 0 0 0 0    Past Medical History:  Diagnosis Date   IBS (irritable bowel syndrome)     No past surgical history on file.  Current Outpatient Medications on File Prior to Visit  Medication Sig Dispense Refill   Loperamide HCl (IMODIUM PO) Take by mouth.     Multiple Vitamin (MULTIVITAMIN) tablet Take 1 tablet by mouth daily.     norethindrone -ethinyl estradiol (JUNEL 1/20) 1-20 MG-MCG tablet  TAKE 1 TABLET BY MOUTH DAILY FOR CONTINUOUS USE 84 tablet 5   No current facility-administered medications on file prior to visit.    Social History   Socioeconomic History   Marital status: Married    Spouse name: Not on file   Number of children: Not on file   Years of education: Not on file   Highest education level: Not on file  Occupational History   Not on file  Tobacco Use   Smoking status: Former    Current packs/day: 0.00    Types: Cigarettes    Quit date: 04/22/2008    Years since quitting: 15.6    Passive exposure: Past   Smokeless tobacco: Never  Vaping Use   Vaping status: Never Used  Substance and Sexual Activity   Alcohol use: Yes    Comment: 2-5 glasses of wine a week    Drug use: Never   Sexual activity: Yes    Partners: Male    Birth control/protection: OCP    Comment: 1st intercourse- 16, partners- more than 5  Other Topics Concern   Not on file  Social History Narrative   Not on file   Social Drivers of Health   Financial Resource Strain: Not on file  Food Insecurity: Not on file  Transportation Needs: Not  on file  Physical Activity: Not on file  Stress: Not on file  Social Connections: Not on file  Intimate Partner Violence: Not on file    Family History  Problem Relation Age of Onset   Ulcerative colitis Sister    Cancer Paternal Uncle        prostate     No Known Allergies    Patient's last menstrual period was No LMP recorded. (Menstrual status: Oral contraceptives)..            Review of Systems Alls systems reviewed and are negative.     Physical Exam Constitutional:      Appearance: Normal appearance.  Genitourinary:     Vulva and urethral meatus normal.     No lesions in the vagina.     Right Labia: No rash, lesions or skin changes.    Left Labia: No lesions, skin changes or rash.    No vaginal discharge or tenderness.     No vaginal prolapse present.    No vaginal atrophy present.     Right Adnexa: not tender,  not palpable and no mass present.    Left Adnexa: not tender, not palpable and no mass present.    No cervical motion tenderness or discharge.     Uterus is not enlarged, tender or irregular.  Breasts:    Right: Normal.     Left: Normal.  HENT:     Head: Normocephalic.  Neck:     Thyroid : No thyroid  mass, thyromegaly or thyroid  tenderness.  Cardiovascular:     Rate and Rhythm: Normal rate and regular rhythm.     Heart sounds: Normal heart sounds, S1 normal and S2 normal.  Pulmonary:     Effort: Pulmonary effort is normal.     Breath sounds: Normal breath sounds and air entry.  Abdominal:     General: There is no distension.     Palpations: Abdomen is soft. There is no mass.     Tenderness: There is no abdominal tenderness. There is no guarding or rebound.  Musculoskeletal:        General: Normal range of motion.     Cervical back: Full passive range of motion without pain, normal range of motion and neck supple. No tenderness.     Right lower leg: No edema.     Left lower leg: No edema.  Neurological:     Mental Status: She is alert.  Skin:    General: Skin is warm.  Psychiatric:        Mood and Affect: Mood normal.        Behavior: Behavior normal.        Thought Content: Thought content normal.  Vitals and nursing note reviewed. Exam conducted with a chaperone present.       A:         Well Woman GYN exam                             P:        Pap smear collected today Encouraged annual mammogram screening Colon cancer screening referral placed today DXA ordered today Labs and immunizations ordered today Discussed breast self exams Encouraged healthy lifestyle practices Encouraged Vit D and Calcium   No follow-ups on file.  Terri Wilkinson

## 2023-12-09 ENCOUNTER — Ambulatory Visit: Payer: Self-pay | Admitting: Obstetrics and Gynecology

## 2023-12-12 LAB — CARDIO IQ (R) APOLIPOPROTEIN B: Apolipoprotein B: 75 mg/dL (ref <90)

## 2023-12-12 LAB — CYTOLOGY - PAP
Comment: NEGATIVE
Diagnosis: NEGATIVE
High risk HPV: NEGATIVE

## 2023-12-13 LAB — CBC
HCT: 43.5 % (ref 35.0–45.0)
Hemoglobin: 14.7 g/dL (ref 11.7–15.5)
MCH: 31.2 pg (ref 27.0–33.0)
MCHC: 33.8 g/dL (ref 32.0–36.0)
MCV: 92.4 fL (ref 80.0–100.0)
MPV: 10.7 fL (ref 7.5–12.5)
Platelets: 199 Thousand/uL (ref 140–400)
RBC: 4.71 Million/uL (ref 3.80–5.10)
RDW: 12.5 % (ref 11.0–15.0)
WBC: 6.6 Thousand/uL (ref 3.8–10.8)

## 2023-12-13 LAB — COMPREHENSIVE METABOLIC PANEL WITH GFR
AG Ratio: 1.9 (calc) (ref 1.0–2.5)
ALT: 18 U/L (ref 6–29)
AST: 17 U/L (ref 10–35)
Albumin: 4.5 g/dL (ref 3.6–5.1)
Alkaline phosphatase (APISO): 32 U/L (ref 31–125)
BUN: 13 mg/dL (ref 7–25)
CO2: 24 mmol/L (ref 20–32)
Calcium: 9 mg/dL (ref 8.6–10.2)
Chloride: 103 mmol/L (ref 98–110)
Creat: 0.79 mg/dL (ref 0.50–0.99)
Globulin: 2.4 g/dL (ref 1.9–3.7)
Glucose, Bld: 86 mg/dL (ref 65–99)
Potassium: 4.2 mmol/L (ref 3.5–5.3)
Sodium: 139 mmol/L (ref 135–146)
Total Bilirubin: 0.6 mg/dL (ref 0.2–1.2)
Total Protein: 6.9 g/dL (ref 6.1–8.1)
eGFR: 92 mL/min/1.73m2 (ref >=60)

## 2023-12-13 LAB — HEMOGLOBIN A1C
Hgb A1c MFr Bld: 4.9 % (ref <5.7)
Mean Plasma Glucose: 94 mg/dL
eAG (mmol/L): 5.2 mmol/L

## 2023-12-13 LAB — TESTOS,TOTAL,FREE AND SHBG (FEMALE)
Free Testosterone: 2.2 pg/mL (ref 0.1–6.4)
Sex Hormone Binding: 193 nmol/L — ABNORMAL HIGH (ref 17–124)
Testosterone, Total, LC-MS-MS: 33 ng/dL (ref 2–45)

## 2023-12-13 LAB — LIPID PANEL
Cholesterol: 204 mg/dL — ABNORMAL HIGH (ref <200)
HDL: 92 mg/dL (ref >=50)
LDL Cholesterol (Calc): 93 mg/dL
Non-HDL Cholesterol (Calc): 112 mg/dL (ref <130)
Total CHOL/HDL Ratio: 2.2 (calc) (ref <5.0)
Triglycerides: 99 mg/dL (ref <150)

## 2023-12-13 LAB — VITAMIN D 25 HYDROXY (VIT D DEFICIENCY, FRACTURES): Vit D, 25-Hydroxy: 29 ng/mL — ABNORMAL LOW (ref 30–100)

## 2023-12-13 LAB — FOLLICLE STIMULATING HORMONE: FSH: 0.9 m[IU]/mL — ABNORMAL LOW

## 2023-12-13 LAB — ESTRADIOL: Estradiol: <30 pg/mL

## 2023-12-13 LAB — TSH: TSH: 1.88 m[IU]/L

## 2024-01-15 ENCOUNTER — Encounter: Payer: Self-pay | Admitting: Obstetrics and Gynecology

## 2024-01-16 MED ORDER — PROGESTERONE MICRONIZED 100 MG PO CAPS: 200.0000 mg | ORAL_CAPSULE | Freq: Every day | ORAL | 11 refills | Status: AC

## 2024-02-10 ENCOUNTER — Ambulatory Visit (HOSPITAL_BASED_OUTPATIENT_CLINIC_OR_DEPARTMENT_OTHER)
Admission: RE | Admit: 2024-02-10 | Discharge: 2024-02-10 | Disposition: A | Payer: Self-pay | Source: Ambulatory Visit | Attending: Obstetrics and Gynecology

## 2024-02-10 DIAGNOSIS — Z01419 Encounter for gynecological examination (general) (routine) without abnormal findings: Secondary | ICD-10-CM | POA: Insufficient documentation

## 2024-02-10 DIAGNOSIS — E2839 Other primary ovarian failure: Secondary | ICD-10-CM | POA: Insufficient documentation

## 2024-04-09 ENCOUNTER — Ambulatory Visit (HOSPITAL_BASED_OUTPATIENT_CLINIC_OR_DEPARTMENT_OTHER): Admitting: Family Medicine
